# Patient Record
Sex: Female | Born: 1935 | Race: White | Hispanic: No | Smoking: Never smoker
Health system: Southern US, Community
[De-identification: ages and names within clinical notes are randomized; demographics above are authoritative.]

## PROBLEM LIST (undated history)

## (undated) DIAGNOSIS — I10 Essential (primary) hypertension: Secondary | ICD-10-CM

## (undated) DIAGNOSIS — E785 Hyperlipidemia, unspecified: Secondary | ICD-10-CM

## (undated) DIAGNOSIS — F325 Major depressive disorder, single episode, in full remission: Secondary | ICD-10-CM

## (undated) DIAGNOSIS — K219 Gastro-esophageal reflux disease without esophagitis: Secondary | ICD-10-CM

## (undated) DIAGNOSIS — G629 Polyneuropathy, unspecified: Secondary | ICD-10-CM

## (undated) DIAGNOSIS — E039 Hypothyroidism, unspecified: Secondary | ICD-10-CM

## (undated) DIAGNOSIS — I779 Disorder of arteries and arterioles, unspecified: Secondary | ICD-10-CM

## (undated) HISTORY — DX: Hypothyroidism, unspecified: E03.9

## (undated) HISTORY — PX: TOTAL HIP ARTHROPLASTY: SHX124

## (undated) HISTORY — PX: ORIF DISTAL RADIUS FRACTURE: SUR927

## (undated) HISTORY — DX: Disorder of arteries and arterioles, unspecified: I77.9

## (undated) HISTORY — DX: Essential (primary) hypertension: I10

## (undated) HISTORY — DX: Polyneuropathy, unspecified: G62.9

## (undated) HISTORY — DX: Hyperlipidemia, unspecified: E78.5

## (undated) HISTORY — DX: Major depressive disorder, single episode, in full remission: F32.5

## (undated) HISTORY — DX: Gastro-esophageal reflux disease without esophagitis: K21.9

---

## 2019-08-07 ENCOUNTER — Other Ambulatory Visit: Payer: Self-pay

## 2019-08-07 ENCOUNTER — Encounter: Payer: Self-pay | Admitting: Internal Medicine

## 2019-08-07 ENCOUNTER — Ambulatory Visit (INDEPENDENT_AMBULATORY_CARE_PROVIDER_SITE_OTHER): Payer: Medicare Other | Admitting: Internal Medicine

## 2019-08-07 VITALS — BP 130/80 | HR 65 | Temp 97.1°F | Ht 70.0 in | Wt 150.0 lb

## 2019-08-07 DIAGNOSIS — E785 Hyperlipidemia, unspecified: Secondary | ICD-10-CM | POA: Insufficient documentation

## 2019-08-07 DIAGNOSIS — E039 Hypothyroidism, unspecified: Secondary | ICD-10-CM | POA: Diagnosis not present

## 2019-08-07 DIAGNOSIS — I779 Disorder of arteries and arterioles, unspecified: Secondary | ICD-10-CM | POA: Insufficient documentation

## 2019-08-07 DIAGNOSIS — I10 Essential (primary) hypertension: Secondary | ICD-10-CM

## 2019-08-07 DIAGNOSIS — F325 Major depressive disorder, single episode, in full remission: Secondary | ICD-10-CM | POA: Diagnosis not present

## 2019-08-07 DIAGNOSIS — G629 Polyneuropathy, unspecified: Secondary | ICD-10-CM | POA: Insufficient documentation

## 2019-08-07 DIAGNOSIS — I6523 Occlusion and stenosis of bilateral carotid arteries: Secondary | ICD-10-CM

## 2019-08-07 DIAGNOSIS — K219 Gastro-esophageal reflux disease without esophagitis: Secondary | ICD-10-CM

## 2019-08-07 LAB — COMPREHENSIVE METABOLIC PANEL
ALT: 10 U/L (ref 0–35)
AST: 16 U/L (ref 0–37)
Albumin: 4.9 g/dL (ref 3.5–5.2)
Alkaline Phosphatase: 62 U/L (ref 39–117)
BUN: 16 mg/dL (ref 6–23)
CO2: 30 mEq/L (ref 19–32)
Calcium: 10.3 mg/dL (ref 8.4–10.5)
Chloride: 98 mEq/L (ref 96–112)
Creatinine, Ser: 0.8 mg/dL (ref 0.40–1.20)
GFR: 67.6 mL/min (ref >60.00)
Glucose, Bld: 91 mg/dL (ref 70–99)
Potassium: 4.3 mEq/L (ref 3.5–5.1)
Sodium: 137 mEq/L (ref 135–145)
Total Bilirubin: 0.7 mg/dL (ref 0.2–1.2)
Total Protein: 7.3 g/dL (ref 6.0–8.3)

## 2019-08-07 LAB — T4, FREE: Free T4: 1.02 ng/dL (ref 0.60–1.60)

## 2019-08-07 LAB — LIPID PANEL
Cholesterol: 168 mg/dL (ref 0–200)
HDL: 54.7 mg/dL (ref >39.00)
LDL Cholesterol: 90 mg/dL (ref 0–99)
NonHDL: 112.97
Total CHOL/HDL Ratio: 3
Triglycerides: 116 mg/dL (ref 0.0–149.0)
VLDL: 23.2 mg/dL (ref 0.0–40.0)

## 2019-08-07 LAB — CBC
HCT: 41.4 % (ref 36.0–46.0)
Hemoglobin: 14.3 g/dL (ref 12.0–15.0)
MCHC: 34.6 g/dL (ref 30.0–36.0)
MCV: 93.8 fl (ref 78.0–100.0)
Platelets: 215 10*3/uL (ref 150.0–400.0)
RBC: 4.41 Mil/uL (ref 3.87–5.11)
RDW: 13.6 % (ref 11.5–15.5)
WBC: 4.7 10*3/uL (ref 4.0–10.5)

## 2019-08-07 LAB — TSH: TSH: 5.43 u[IU]/mL — ABNORMAL HIGH (ref 0.35–4.50)

## 2019-08-07 NOTE — Assessment & Plan Note (Signed)
In remission on mirtazapine Seems appropriate to continue for now

## 2019-08-07 NOTE — Assessment & Plan Note (Signed)
BP Readings from Last 3 Encounters:  08/07/19 130/80   Good control on metoprolol Will check labs

## 2019-08-07 NOTE — Progress Notes (Signed)
Subjective:    Patient ID: Nicole Ellison, female    DOB: 1928/08/20, 86 y.o.   MRN: [id number]  HPI Here with son Annette Stable to establish care This visit occurred during the SARS-CoV-2 public health emergency.  Safety protocols were in place, including screening questions prior to the visit, additional usage of staff PPE, and extensive cleaning of exam room while observing appropriate contact time as indicated for disinfecting solutions.   Now in independent living at [facility] Came there 10 days ago So far--so good  Has had HTN for some time On metoprolol---seems to be working okay Mild sitting exercises No chest pain or SOB No palpitations No edema  3 recent ER visits  Would have dizziness, floaters in her eyes (seen by retinal specialists), BP elelvated Work up including CT, etc---no diagnosis No recent dizziness  Has been diagnosed with carotid artery disese (and TIA) They don't know that she really had TIA Is on statin  Known HLD No problems with statin  GERD Is on protonix daily No heartburn or dysphagia on this  Has known MDD single after husband died Some anxiety as well Mirtazapine seems to be helping for this Depression seems to be in remission Had seen a counselor for a year or so--- was helpful  Hypothyroidism for years On synthroid 75 Energy levels are okay  Known neuropathy Gabapentin at night helps her sleep  Current Outpatient Medications on File Prior to Visit  Medication Sig Dispense Refill  . atorvastatin (LIPITOR) 10 MG tablet Take 10 mg by mouth daily.    . Calcium Carbonate (CALCIUM 600 PO) Take by mouth 2 (two) times daily.    . clopidogrel (PLAVIX) 75 MG tablet Take 75 mg by mouth daily.    Marland Kitchen gabapentin (NEURONTIN) 400 MG capsule Take 2 capsules by mouth at bedtime.    Marland Kitchen levothyroxine (SYNTHROID) 75 MCG tablet Take 75 mcg by mouth daily before breakfast.    . metoprolol succinate (TOPROL-XL) 25 MG 24 hr tablet Take 1 tablet by  mouth 2 (two) times daily.    . mirtazapine (REMERON) 30 MG tablet Take 30 mg by mouth at bedtime.    . pantoprazole (PROTONIX) 20 MG tablet Take 1 tablet by mouth daily.    . vitamin B-12 (CYANOCOBALAMIN) 1000 MCG tablet Take 1,000 mcg by mouth daily.     No current facility-administered medications on file prior to visit.    Allergies  Allergen Reactions  . Avelox [Moxifloxacin]   . Levaquin [Levofloxacin]     Past Medical History:  Diagnosis Date  . Carotid arterial disease (HCC)   . GERD (gastroesophageal reflux disease)   . Hyperlipidemia   . Hypertension   . Hypothyroidism   . MDD (major depressive disorder), single episode, in full remission (HCC)   . Polyneuropathy     Past Surgical History:  Procedure Laterality Date  . ORIF DISTAL RADIUS FRACTURE     with redo twice  . TOTAL HIP ARTHROPLASTY Right ~2011    Family History  Problem Relation Age of Onset  . Alzheimer's disease Mother   . Heart disease Father   . Liver cancer Sister   . Alzheimer's disease Brother     Social History   Socioeconomic History  . Marital status: Widowed    Spouse name: Not on file  . Number of children: 2  . Years of education: Not on file  . Highest education level: Not on file  Occupational History  . Occupation: Haematologist  Comment: Retired 1995  Tobacco Use  . Smoking status: Never Smoker  . Smokeless tobacco: Never Used  Substance and Sexual Activity  . Alcohol use: Not Currently  . Drug use: Not on file  . Sexual activity: Not on file  Other Topics Concern  . Not on file  Social History Narrative   Has living will   Son Annette Stable is health care POA   Has DNR --confirmed   No tube feeds   Social Determinants of Health   Financial Resource Strain:   . Difficulty of Paying Living Expenses:   Food Insecurity:   . Worried About Programme researcher, broadcasting/film/video in the Last Year:   . Barista in the Last Year:   Transportation Needs:   . Freight forwarder  (Medical):   Marland Kitchen Lack of Transportation (Non-Medical):   Physical Activity:   . Days of Exercise per Week:   . Minutes of Exercise per Session:   Stress:   . Feeling of Stress :   Social Connections:   . Frequency of Communication with Friends and Family:   . Frequency of Social Gatherings with Friends and Family:   . Attends Religious Services:   . Active Member of Clubs or Organizations:   . Attends Banker Meetings:   Marland Kitchen Marital Status:   Intimate Partner Violence:   . Fear of Current or Ex-Partner:   . Emotionally Abused:   Marland Kitchen Physically Abused:   . Sexually Abused:     Review of Systems Appetite is okay Weight is stable Sleeps well with gabapentin--no sig AM grogginess Doesn't drive Wears seat belt Bowels move fine--no fine No urinary problems No sig back or joint pains Right ear hearing seems to have worsened lately    Objective:   Physical Exam Constitutional:      Appearance: Normal appearance.  HENT:     Mouth/Throat:     Mouth: Mucous membranes are moist.     Comments: No lesions Cardiovascular:     Rate and Rhythm: Normal rate and regular rhythm.     Pulses: Normal pulses.     Heart sounds: No murmur heard.  No gallop.   Pulmonary:     Effort: Pulmonary effort is normal.     Breath sounds: Normal breath sounds. No wheezing or rales.  Abdominal:     Palpations: Abdomen is soft.     Tenderness: There is no abdominal tenderness.  Musculoskeletal:     Cervical back: Neck supple.     Right lower leg: No edema.     Left lower leg: No edema.  Lymphadenopathy:     Cervical: No cervical adenopathy.  Skin:    General: Skin is warm.     Findings: No rash.  Neurological:     Mental Status: She is alert.     Comments: Mild leg weakness  Psychiatric:        Mood and Affect: Mood normal.        Behavior: Behavior normal.            Assessment & Plan:

## 2019-08-07 NOTE — Assessment & Plan Note (Signed)
Uses the gabapentin at bedtime Asked her try reducing dose at bedtime

## 2019-08-07 NOTE — Assessment & Plan Note (Signed)
Also has TIA diagnosis--but they don't know about this Is on statin and plavix Will try to get records

## 2019-08-07 NOTE — Assessment & Plan Note (Signed)
Seems to be euthyroid Levothyroxine 75

## 2019-08-07 NOTE — Assessment & Plan Note (Signed)
?  secondary prevention with statin

## 2019-10-18 ENCOUNTER — Telehealth: Payer: Self-pay | Admitting: Internal Medicine

## 2019-10-18 NOTE — Telephone Encounter (Signed)
Annette Stable (pt son) called wanting to make sure brooke has the list of rx that he dropped off yesterday.  He stated he spoke with brooke about pt medication and getting refills.  He stated brooke told him to drop off list to give to brooke.   Envelope on brooke's desk

## 2019-11-11 ENCOUNTER — Other Ambulatory Visit: Payer: Self-pay

## 2019-11-11 MED ORDER — PANTOPRAZOLE SODIUM 20 MG PO TBEC: 20.0000 mg | DELAYED_RELEASE_TABLET | Freq: Every day | ORAL | 3 refills | Status: DC

## 2019-11-11 NOTE — Telephone Encounter (Signed)
Rx sent electronically.  

## 2019-11-11 NOTE — Telephone Encounter (Signed)
Pt's husband called for a refill to [facility]

## 2019-11-18 MED ORDER — LEVOTHYROXINE SODIUM 75 MCG PO TABS: 75.0000 ug | ORAL_TABLET | Freq: Every day | ORAL | 3 refills | Status: DC

## 2019-11-18 MED ORDER — MIRTAZAPINE 30 MG PO TABS: 30.0000 mg | ORAL_TABLET | Freq: Every day | ORAL | 3 refills | Status: DC

## 2019-11-18 MED ORDER — METOPROLOL SUCCINATE ER 25 MG PO TB24: 25.0000 mg | ORAL_TABLET | Freq: Two times a day (BID) | ORAL | 3 refills | Status: DC

## 2019-11-18 MED ORDER — ATORVASTATIN CALCIUM 10 MG PO TABS: 10.0000 mg | ORAL_TABLET | Freq: Every day | ORAL | 3 refills | Status: DC

## 2019-11-18 MED ORDER — CLOPIDOGREL BISULFATE 75 MG PO TABS: 75.0000 mg | ORAL_TABLET | Freq: Every day | ORAL | 3 refills | Status: DC

## 2019-11-18 MED ORDER — GABAPENTIN 400 MG PO CAPS: 800.0000 mg | ORAL_CAPSULE | Freq: Every day | ORAL | 3 refills | Status: DC

## 2019-11-18 NOTE — Telephone Encounter (Signed)
Received another letter in tower up front with medications.

## 2019-11-18 NOTE — Addendum Note (Signed)
Addended by: Eual Fines on: 11/18/2019 04:56 PM   Modules accepted: Orders

## 2019-11-18 NOTE — Telephone Encounter (Signed)
Sent refills to [facility] for everything on the list except for pantoprazole since we just sent that.

## 2019-11-22 ENCOUNTER — Telehealth: Payer: Self-pay | Admitting: Internal Medicine

## 2019-11-22 DIAGNOSIS — H900 Conductive hearing loss, bilateral: Secondary | ICD-10-CM

## 2019-11-22 NOTE — Telephone Encounter (Signed)
For what reason? Called son to find out. Wants a hearing test and a possible new hearing aids. Says her hearing has deteriorated in the last few years.

## 2019-11-22 NOTE — Telephone Encounter (Signed)
Referral placed.

## 2019-11-22 NOTE — Telephone Encounter (Signed)
Pt son called in due to needed a referral for [facility] Dr. Barton Fanny. Please advise

## 2019-12-17 ENCOUNTER — Telehealth: Payer: Self-pay

## 2019-12-17 NOTE — Telephone Encounter (Signed)
Mrs. Ravneet son, left a parking placard application to be filled out by provider. Charge sheet and form are attached to together and left on the cart under Dr. Vassie Moselle name.

## 2019-12-17 NOTE — Telephone Encounter (Signed)
Form signed No charge 

## 2019-12-17 NOTE — Telephone Encounter (Signed)
Patient's son, Annette Stable, notified form is ready for pick up.

## 2019-12-17 NOTE — Telephone Encounter (Signed)
Form placed in Dr Letvak's inbox on his desk 

## 2020-05-26 ENCOUNTER — Encounter: Payer: Self-pay | Admitting: Internal Medicine

## 2020-05-26 ENCOUNTER — Other Ambulatory Visit: Payer: Self-pay

## 2020-05-26 ENCOUNTER — Ambulatory Visit (INDEPENDENT_AMBULATORY_CARE_PROVIDER_SITE_OTHER): Payer: Medicare Other | Admitting: Internal Medicine

## 2020-05-26 VITALS — BP 120/82 | HR 62 | Temp 97.6°F | Ht 70.0 in | Wt 151.0 lb

## 2020-05-26 DIAGNOSIS — I10 Essential (primary) hypertension: Secondary | ICD-10-CM

## 2020-05-26 MED ORDER — LOSARTAN POTASSIUM 25 MG PO TABS: 25.0000 mg | ORAL_TABLET | Freq: Every day | ORAL | 3 refills | Status: DC

## 2020-05-26 NOTE — Assessment & Plan Note (Signed)
BP Readings from Last 3 Encounters:  05/26/20 120/82  08/07/19 130/80   Repeat 160/70 on the left by me May have masked HTN or just anxiety causing increases Discussed options--I recommend starting a low dose of losartan

## 2020-05-26 NOTE — Progress Notes (Signed)
Subjective:    Patient ID: Nicole Ellison, female    DOB: Oct 26, 1928, 87 y.o.   MRN: [id number]  HPI Here to review elevated blood pressures Son is here This visit occurred during the SARS-CoV-2 public health emergency.  Safety protocols were in place, including screening questions prior to the visit, additional usage of staff PPE, and extensive cleaning of exam room while observing appropriate contact time as indicated for disinfecting solutions.   See email from Olean RN at [facility] Some very high measurements--- mostly your own machine Has tried to sit for a while before doing them  Has noted some anxiety Might be related to her high measurements No chest pain or SOB No headaches  Current Outpatient Medications on File Prior to Visit  Medication Sig Dispense Refill  . atorvastatin (LIPITOR) 10 MG tablet Take 1 tablet (10 mg total) by mouth daily. 90 tablet 3  . Calcium Carbonate (CALCIUM 600 PO) Take by mouth 2 (two) times daily.    . clopidogrel (PLAVIX) 75 MG tablet Take 1 tablet (75 mg total) by mouth daily. 90 tablet 3  . gabapentin (NEURONTIN) 400 MG capsule Take 2 capsules (800 mg total) by mouth at bedtime. 180 capsule 3  . levothyroxine (SYNTHROID) 75 MCG tablet Take 1 tablet (75 mcg total) by mouth daily before breakfast. 90 tablet 3  . metoprolol succinate (TOPROL-XL) 25 MG 24 hr tablet Take 1 tablet (25 mg total) by mouth 2 (two) times daily. 180 tablet 3  . mirtazapine (REMERON) 30 MG tablet Take 1 tablet (30 mg total) by mouth at bedtime. 90 tablet 3  . pantoprazole (PROTONIX) 20 MG tablet Take 1 tablet (20 mg total) by mouth daily. 90 tablet 3  . vitamin B-12 (CYANOCOBALAMIN) 1000 MCG tablet Take 1,000 mcg by mouth daily.     No current facility-administered medications on file prior to visit.    Allergies  Allergen Reactions  . Avelox [Moxifloxacin]   . Levaquin [Levofloxacin]     Past Medical History:  Diagnosis Date  . Carotid arterial disease (HCC)   .  GERD (gastroesophageal reflux disease)   . Hyperlipidemia   . Hypertension   . Hypothyroidism   . MDD (major depressive disorder), single episode, in full remission (HCC)   . Polyneuropathy     Past Surgical History:  Procedure Laterality Date  . ORIF DISTAL RADIUS FRACTURE     with redo twice  . TOTAL HIP ARTHROPLASTY Right ~2011    Family History  Problem Relation Age of Onset  . Alzheimer's disease Mother   . Heart disease Father   . Liver cancer Sister   . Alzheimer's disease Brother     Social History   Socioeconomic History  . Marital status: Widowed    Spouse name: Not on file  . Number of children: 2  . Years of education: Not on file  . Highest education level: Not on file  Occupational History  . Occupation: Haematologist    Comment: Retired 1995  Tobacco Use  . Smoking status: Never Smoker  . Smokeless tobacco: Never Used  Substance and Sexual Activity  . Alcohol use: Not Currently  . Drug use: Not on file  . Sexual activity: Not on file  Other Topics Concern  . Not on file  Social History Narrative   Has living will   Son Annette Stable is health care POA   Has DNR --confirmed   No tube feeds   Social Determinants of Health   Financial  Resource Strain: Not on file  Food Insecurity: Not on file  Transportation Needs: Not on file  Physical Activity: Not on file  Stress: Not on file  Social Connections: Not on file  Intimate Partner Violence: Not on file   Review of Systems  Some nausea Sleeps well Appetite is good Weight stable Still doing normal activities--walking regularly     Objective:   Physical Exam Constitutional:      Appearance: Normal appearance.  Cardiovascular:     Rate and Rhythm: Normal rate and regular rhythm.     Heart sounds: No murmur heard. No gallop.   Pulmonary:     Effort: Pulmonary effort is normal.     Breath sounds: Normal breath sounds. No wheezing or rales.  Musculoskeletal:     Cervical back: Neck supple.      Right lower leg: No edema.     Left lower leg: No edema.  Lymphadenopathy:     Cervical: No cervical adenopathy.  Neurological:     Mental Status: She is alert.            Assessment & Plan:

## 2020-06-23 ENCOUNTER — Emergency Department
Admission: EM | Admit: 2020-06-23 | Discharge: 2020-06-23 | Disposition: A | Discharge status: Other Acute Inpatient Hospital | Payer: Medicare Other | Attending: Emergency Medicine | Admitting: Emergency Medicine

## 2020-06-23 ENCOUNTER — Other Ambulatory Visit: Payer: Self-pay

## 2020-06-23 DIAGNOSIS — Z7902 Long term (current) use of antithrombotics/antiplatelets: Secondary | ICD-10-CM | POA: Insufficient documentation

## 2020-06-23 DIAGNOSIS — I10 Essential (primary) hypertension: Secondary | ICD-10-CM | POA: Diagnosis not present

## 2020-06-23 DIAGNOSIS — E039 Hypothyroidism, unspecified: Secondary | ICD-10-CM | POA: Insufficient documentation

## 2020-06-23 DIAGNOSIS — F419 Anxiety disorder, unspecified: Secondary | ICD-10-CM

## 2020-06-23 DIAGNOSIS — Z79899 Other long term (current) drug therapy: Secondary | ICD-10-CM | POA: Diagnosis not present

## 2020-06-23 DIAGNOSIS — Z96641 Presence of right artificial hip joint: Secondary | ICD-10-CM | POA: Insufficient documentation

## 2020-06-23 MED ORDER — LORAZEPAM 1 MG PO TABS: 1.0000 mg | ORAL_TABLET | Freq: Once | ORAL | Status: DC

## 2020-06-23 MED ORDER — LORAZEPAM 0.5 MG PO TABS
0.5000 mg | ORAL_TABLET | Freq: Once | ORAL | Status: AC
  Administered 2020-06-23: 0.5 mg via ORAL
  Filled 2020-06-23: qty 1

## 2020-06-23 NOTE — ED Notes (Signed)
Pt assisted to the bathroom. Urine sample collected and sent to lab

## 2020-06-23 NOTE — ED Provider Notes (Signed)
[facility] Emergency Department Provider Note ____________________________________________   Event Date/Time   First MD Initiated Contact with Patient 06/23/20 1624     (approximate)  I have reviewed the triage vital signs and the nursing notes.  HISTORY  Chief Complaint Hypertension   HPI Ranyah Groeneveld is a 87 y.o. femalewho presents to the ED for evaluation of asymptomatic hypertension.  Chart review indicates history of HTN, HLD, hypothyroidism and depression.  Anxiety. Patient resides at the independent living section of a local SNF, [facility].  I see a video visit with her SNF on 5/3 with complaints of hypertension and she was started on losartan.   Patient presents to the ED via EMS from home for evaluation of asymptomatic hypertension.  Patient reports having a home health nurse that comes every Tuesday to check on her, help around the house and monitor her vital signs.  Patient reports having hypertensive blood pressures at home systolic around 200, and therefore called EMS for evaluation.  Patient tearfully reports that yesterday and Sunday were very difficult day for her due to the recent holiday, memorial day.  She tells me at length about her husband of 67 years who died just 3 years ago, who was a 20-year veteran of the [employer].  She reports significant difficulty on Sunday when watching a parade with other service members and reports feeling lonely and anxious yesterday.  She denies any suicidal thoughts or intent, denies hallucinations.  She denies pain anywhere, including headache, chest pain, syncopal episodes, abdominal pain, emesis or diarrhea.  Denies changes to p.o. intake or toileting.  Past Medical History:  Diagnosis Date  . Carotid arterial disease (HCC)   . GERD (gastroesophageal reflux disease)   . Hyperlipidemia   . Hypertension   . Hypothyroidism   . MDD (major depressive disorder), single episode, in full remission (HCC)   .  Polyneuropathy     Patient Active Problem List   Diagnosis Date Noted  . Hypertension   . Hyperlipidemia   . Hypothyroidism   . MDD (major depressive disorder), single episode, in full remission (HCC)   . Polyneuropathy   . Carotid arterial disease (HCC)   . GERD (gastroesophageal reflux disease)     Past Surgical History:  Procedure Laterality Date  . ORIF DISTAL RADIUS FRACTURE     with redo twice  . TOTAL HIP ARTHROPLASTY Right ~2011    Prior to Admission medications   Medication Sig Start Date End Date Taking? Authorizing Provider  atorvastatin (LIPITOR) 10 MG tablet Take 1 tablet (10 mg total) by mouth daily. 11/18/19   Karie Schwalbe, MD  Calcium Carbonate (CALCIUM 600 PO) Take by mouth 2 (two) times daily.    [provider]  clopidogrel (PLAVIX) 75 MG tablet Take 1 tablet (75 mg total) by mouth daily. 11/18/19   Karie Schwalbe, MD  gabapentin (NEURONTIN) 400 MG capsule Take 2 capsules (800 mg total) by mouth at bedtime. 11/18/19   Karie Schwalbe, MD  levothyroxine (SYNTHROID) 75 MCG tablet Take 1 tablet (75 mcg total) by mouth daily before breakfast. 11/18/19   Karie Schwalbe, MD  losartan (COZAAR) 25 MG tablet Take 1 tablet (25 mg total) by mouth daily. 05/26/20   Karie Schwalbe, MD  metoprolol succinate (TOPROL-XL) 25 MG 24 hr tablet Take 1 tablet (25 mg total) by mouth 2 (two) times daily. 11/18/19   Karie Schwalbe, MD  mirtazapine (REMERON) 30 MG tablet Take 1 tablet (30 mg  total) by mouth at bedtime. 11/18/19   Karie Schwalbe, MD  pantoprazole (PROTONIX) 20 MG tablet Take 1 tablet (20 mg total) by mouth daily. 11/11/19   Karie Schwalbe, MD  vitamin B-12 (CYANOCOBALAMIN) 1000 MCG tablet Take 1,000 mcg by mouth daily.    [provider]    Allergies Avelox [moxifloxacin] and Levaquin [levofloxacin]  Family History  Problem Relation Age of Onset  . Alzheimer's disease Mother   . Heart disease Father   . Liver cancer Sister    . Alzheimer's disease Brother     Social History Social History   Tobacco Use  . Smoking status: Never Smoker  . Smokeless tobacco: Never Used  Substance Use Topics  . Alcohol use: Not Currently    Review of Systems  Constitutional: No fever/chills Eyes: No visual changes. ENT: No sore throat. Cardiovascular: Denies chest pain. Respiratory: Denies shortness of breath. Gastrointestinal: No abdominal pain.  No nausea, no vomiting.  No diarrhea.  No constipation. Genitourinary: Negative for dysuria. Musculoskeletal: Negative for back pain. Skin: Negative for rash. Neurological: Negative for headaches, focal weakness or numbness.  ____________________________________________   PHYSICAL EXAM:  VITAL SIGNS: Vitals:   06/23/20 1830 06/23/20 1832  BP: (!) 185/79 (!) 179/77  Pulse: 63 63  Resp: 19 18  Temp:    SpO2: 98% 98%      Constitutional: Alert and oriented. In no acute distress.  Tearful when discussing her husband and recent memorial day.  Otherwise hard of hearing and conversational. Eyes: Conjunctivae are normal. PERRL. EOMI. Head: Atraumatic. Nose: No congestion/rhinnorhea. Mouth/Throat: Mucous membranes are moist.  Oropharynx non-erythematous. Neck: No stridor. No cervical spine tenderness to palpation. Cardiovascular: Normal rate, regular rhythm. Grossly normal heart sounds.  Good peripheral circulation. Respiratory: Normal respiratory effort.  No retractions. Lungs CTAB. Gastrointestinal: Soft , nondistended, nontender to palpation. No CVA tenderness. Musculoskeletal: No lower extremity tenderness nor edema.  No joint effusions. No signs of acute trauma. Neurologic:  Normal speech and language. No gross focal neurologic deficits are appreciated. No gait instability noted. Cranial nerves II through XII intact 5/5 strength and sensation in all 4 extremities Skin:  Skin is warm, dry and intact. No rash noted. Psychiatric: Mood and affect are normal. Speech  and behavior are normal.  ____________________________________________   LABS (all labs ordered are listed, but only abnormal results are displayed)  Labs Reviewed - No data to display ____________________________________________  12 Lead EKG  Sinus rhythm, rate of 66 bpm.  Normal axis and intervals.  No evidence of acute ischemia. ____________________________________________   PROCEDURES and INTERVENTIONS  Procedure(s) performed (including Critical Care):  .1-3 Lead EKG Interpretation Performed by: Delton Prairie, MD Authorized by: Delton Prairie, MD     Interpretation: normal     ECG rate:  64   ECG rate assessment: normal     Rhythm: sinus rhythm     Ectopy: none     Conduction: normal      Medications  LORazepam (ATIVAN) tablet 0.5 mg (0.5 mg Oral Given 06/23/20 1728)    ____________________________________________   MDM / ED COURSE   87 year old woman presents to the ED with asymptomatic hypertension and anxiety, ultimately amenable to outpatient management.  She presents hypertensive with systolics around 200, and otherwise normal vitals on room air.  She denies symptoms of this and reports feeling fine, until she discusses her recently deceased husband and becomes quite tearful.  She has no evidence of suicidality or emergent psychiatric pathology.  She denies any  pain, including chest pain, headache, syncopal episodes, recent illnesses.  Provide her with a small dose of oral Ativan with resolution of her anxiety and improvement of her blood pressure.  Her EKG is nonischemic and looks well.  Discussed the case at length with patient's son, reports that this happens a few times per year.  He has no additional concerns and I see no indications for more thorough diagnostics at this time considering her lack of symptoms.  She reports compliance with her antihypertensive regimen, and urged her to continue to do this and follow-up with her PCP.  Sat with her at the bedside at  length as we discussed her anxiety, loss of her husband and recently moving to a new SNF where she has not made many new friends yet.  We discussed coping measures for anxiety and we discussed return precautions for the ED.  Patient stable for outpatient management.  Clinical Course as of 06/23/20 1935  Tue Jun 23, 2020  1637 Called son, Annette Stable.  He lives near [facility], just 6 minutes away from mom. Son just had hip replaced 2 weeks ago, can't drive or visit patient. Patient has "these episodes" fairly regularly around holidays related to veterans. Happens a few times per year. Every anniversary, memorial day, veteran's day.  [DS]  1730 Bill calls back to the ED. Reports arranging a ride for his mom to be picked up at about 6:30p.  [DS]  1828 Reassessed.  Patient reports feeling better.  Repeat BP is 183/84.  She reports feeling comfortable going home. [DS]  1829 Continues to deny any pain, including headache, chest pain, shortness of breath or syncope. [DS]  1829 Called Bill back [DS]    Clinical Course User Index [DS] Delton Prairie, MD    ____________________________________________   FINAL CLINICAL IMPRESSION(S) / ED DIAGNOSES  Final diagnoses:  Asymptomatic hypertension  Anxiety     ED Discharge Orders    None       Ruger Saxer Katrinka Blazing   Note:  This document was prepared using Dragon voice recognition software and may include unintentional dictation errors.   Delton Prairie, MD 06/23/20 [phone]

## 2020-06-23 NOTE — ED Notes (Addendum)
Attempted to call [facility] about discharge. Pt's son arranged transportation to facility

## 2020-06-23 NOTE — Discharge Instructions (Signed)
Continue to take all of your medications and follow up with Dr. Alphonsus Sias.   If you develop any severe chest pains, headaches, passing out, please return to ED.

## 2020-06-25 ENCOUNTER — Telehealth: Payer: Self-pay

## 2020-06-25 NOTE — Telephone Encounter (Signed)
Spoke to pt's son to see how pt was doing after recent ER visit for elevated BP and anxiety. Pt's son stated she was fine but gave me her number to call her. I called the pt and she is a bit hard of hearing. She stated she is feeling much better and did not feel her appt in July needed to be moved up to a sooner date.

## 2020-08-04 ENCOUNTER — Other Ambulatory Visit: Payer: Self-pay

## 2020-08-04 ENCOUNTER — Encounter: Payer: Self-pay | Admitting: Internal Medicine

## 2020-08-04 ENCOUNTER — Ambulatory Visit (INDEPENDENT_AMBULATORY_CARE_PROVIDER_SITE_OTHER): Payer: Medicare Other | Admitting: Internal Medicine

## 2020-08-04 VITALS — BP 118/70 | HR 63 | Temp 96.9°F | Ht 68.75 in | Wt 149.0 lb

## 2020-08-04 DIAGNOSIS — E785 Hyperlipidemia, unspecified: Secondary | ICD-10-CM

## 2020-08-04 DIAGNOSIS — I6523 Occlusion and stenosis of bilateral carotid arteries: Secondary | ICD-10-CM | POA: Diagnosis not present

## 2020-08-04 DIAGNOSIS — G629 Polyneuropathy, unspecified: Secondary | ICD-10-CM

## 2020-08-04 DIAGNOSIS — I10 Essential (primary) hypertension: Secondary | ICD-10-CM | POA: Diagnosis not present

## 2020-08-04 DIAGNOSIS — Z0000 Encounter for general adult medical examination without abnormal findings: Secondary | ICD-10-CM | POA: Insufficient documentation

## 2020-08-04 DIAGNOSIS — E039 Hypothyroidism, unspecified: Secondary | ICD-10-CM | POA: Diagnosis not present

## 2020-08-04 DIAGNOSIS — F325 Major depressive disorder, single episode, in full remission: Secondary | ICD-10-CM

## 2020-08-04 DIAGNOSIS — K219 Gastro-esophageal reflux disease without esophagitis: Secondary | ICD-10-CM

## 2020-08-04 LAB — COMPREHENSIVE METABOLIC PANEL
ALT: 11 U/L (ref 0–35)
AST: 17 U/L (ref 0–37)
Albumin: 4.8 g/dL (ref 3.5–5.2)
Alkaline Phosphatase: 57 U/L (ref 39–117)
BUN: 13 mg/dL (ref 6–23)
CO2: 33 mEq/L — ABNORMAL HIGH (ref 19–32)
Calcium: 10.4 mg/dL (ref 8.4–10.5)
Chloride: 99 mEq/L (ref 96–112)
Creatinine, Ser: 0.78 mg/dL (ref 0.40–1.20)
GFR: 67.26 mL/min (ref >60.00)
Glucose, Bld: 97 mg/dL (ref 70–99)
Potassium: 4.4 mEq/L (ref 3.5–5.1)
Sodium: 139 mEq/L (ref 135–145)
Total Bilirubin: 0.8 mg/dL (ref 0.2–1.2)
Total Protein: 7.3 g/dL (ref 6.0–8.3)

## 2020-08-04 LAB — T4, FREE: Free T4: 0.98 ng/dL (ref 0.60–1.60)

## 2020-08-04 LAB — LIPID PANEL
Cholesterol: 170 mg/dL (ref 0–200)
HDL: 53 mg/dL (ref >39.00)
LDL Cholesterol: 96 mg/dL (ref 0–99)
NonHDL: 117.31
Total CHOL/HDL Ratio: 3
Triglycerides: 106 mg/dL (ref 0.0–149.0)
VLDL: 21.2 mg/dL (ref 0.0–40.0)

## 2020-08-04 LAB — CBC
HCT: 39.3 % (ref 36.0–46.0)
Hemoglobin: 13.6 g/dL (ref 12.0–15.0)
MCHC: 34.6 g/dL (ref 30.0–36.0)
MCV: 92 fl (ref 78.0–100.0)
Platelets: 195 10*3/uL (ref 150.0–400.0)
RBC: 4.27 Mil/uL (ref 3.87–5.11)
RDW: 14.1 % (ref 11.5–15.5)
WBC: 3.8 10*3/uL — ABNORMAL LOW (ref 4.0–10.5)

## 2020-08-04 LAB — TSH: TSH: 7.84 u[IU]/mL — ABNORMAL HIGH (ref 0.35–5.50)

## 2020-08-04 MED ORDER — CLOPIDOGREL BISULFATE 75 MG PO TABS
75.0000 mg | ORAL_TABLET | Freq: Every day | ORAL | 3 refills | Status: DC
Start: 2020-08-04 — End: 2021-09-03

## 2020-08-04 MED ORDER — GABAPENTIN 400 MG PO CAPS
800.0000 mg | ORAL_CAPSULE | Freq: Every day | ORAL | 3 refills | Status: DC
Start: 2020-08-04 — End: 2021-08-09

## 2020-08-04 NOTE — Assessment & Plan Note (Signed)
Quiet Discussed skipping 1-2 days a week on the protonix

## 2020-08-04 NOTE — Assessment & Plan Note (Signed)
BP Readings from Last 3 Encounters:  08/04/20 118/70  06/23/20 (!) 179/77  05/26/20 120/82   Good control on the losartan and metoprolol

## 2020-08-04 NOTE — Assessment & Plan Note (Signed)
Does okay with the gabapentin 

## 2020-08-04 NOTE — Assessment & Plan Note (Signed)
I have personally reviewed the Medicare Annual Wellness questionnaire and have noted 1. The patient's medical and social history 2. Their use of alcohol, tobacco or illicit drugs 3. Their current medications and supplements 4. The patient's functional ability including ADL's, fall risks, home safety risks and hearing or visual             impairment. 5. Diet and physical activities 6. Evidence for depression or mood disorders  The patients weight, height, BMI and visual acuity have been recorded in the chart I have made referrals, counseling and provided education to the patient based review of the above and I have provided the pt with a written personalized care plan for preventive services.  I have provided you with a copy of your personalized plan for preventive services. Please take the time to review along with your updated medication list.  Had second COVID booster Has had pneumonia vaccines, etc Td if any injury Discussed resistance exercise No cancer screening due to age

## 2020-08-04 NOTE — Progress Notes (Signed)
Subjective:    Patient ID: Nicole Ellison, female    DOB: March 20, 1928, 87 y.o.   MRN: [id number]  HPI Here with son for Medicare wellness visit and follow up of chronic health conditions This visit occurred during the SARS-CoV-2 public health emergency.  Safety protocols were in place, including screening questions prior to the visit, additional usage of staff PPE, and extensive cleaning of exam room while observing appropriate contact time as indicated for disinfecting solutions.   Reviewed form and advanced directives Reviewed other doctors Walks regularly--- occasionally goes to fitness center Has had 2 minor falls --no injury Hearing is worse--even with aides. ?cerumen build up No alcohol or tobacco Cleaning service monthly--does laundry, cooking, dishes. Some meals delivered Doesn't do stairs No sig problems with memory  BP seems to be better No chest pain or SOB No dizziness ro syncope No edema Some palpitations---with anxiety Known carotid disease ---still on plavix  Depression mostly controlled Mostly gets jittery and nervous--gets things on her mind Does get some nausea when nervous Seeing counselor  Continues on thyroid med Appetite is good Weight down slightly  Continues on gabapentin for neuropathy  Takes protonix daily Controls heartburn--no dysphagia  Current Outpatient Medications on File Prior to Visit  Medication Sig Dispense Refill   atorvastatin (LIPITOR) 10 MG tablet Take 1 tablet (10 mg total) by mouth daily. 90 tablet 3   Calcium Carbonate (CALCIUM 600 PO) Take by mouth 2 (two) times daily.     clopidogrel (PLAVIX) 75 MG tablet Take 1 tablet (75 mg total) by mouth daily. 90 tablet 3   gabapentin (NEURONTIN) 400 MG capsule Take 2 capsules (800 mg total) by mouth at bedtime. 180 capsule 3   levothyroxine (SYNTHROID) 75 MCG tablet Take 1 tablet (75 mcg total) by mouth daily before breakfast. 90 tablet 3   losartan (COZAAR) 25 MG tablet Take 1 tablet (25  mg total) by mouth daily. 90 tablet 3   metoprolol succinate (TOPROL-XL) 25 MG 24 hr tablet Take 1 tablet (25 mg total) by mouth 2 (two) times daily. 180 tablet 3   mirtazapine (REMERON) 30 MG tablet Take 1 tablet (30 mg total) by mouth at bedtime. 90 tablet 3   pantoprazole (PROTONIX) 20 MG tablet Take 1 tablet (20 mg total) by mouth daily. 90 tablet 3   vitamin B-12 (CYANOCOBALAMIN) 1000 MCG tablet Take 1,000 mcg by mouth daily.     No current facility-administered medications on file prior to visit.    Allergies  Allergen Reactions   Avelox [Moxifloxacin]    Levaquin [Levofloxacin]     Past Medical History:  Diagnosis Date   Carotid arterial disease (HCC)    GERD (gastroesophageal reflux disease)    Hyperlipidemia    Hypertension    Hypothyroidism    MDD (major depressive disorder), single episode, in full remission (HCC)    Polyneuropathy     Past Surgical History:  Procedure Laterality Date   ORIF DISTAL RADIUS FRACTURE     with redo twice   TOTAL HIP ARTHROPLASTY Right ~2011    Family History  Problem Relation Age of Onset   Alzheimer's disease Mother    Heart disease Father    Liver cancer Sister    Alzheimer's disease Brother     Social History   Socioeconomic History   Marital status: Widowed    Spouse name: Not on file   Number of children: 2   Years of education: Not on file   Highest education level: Not  on file  Occupational History   Occupation: Haematologist    Comment: Retired 1995  Tobacco Use   Smoking status: Never   Smokeless tobacco: Never  Substance and Sexual Activity   Alcohol use: Not Currently   Drug use: Not on file   Sexual activity: Not on file  Other Topics Concern   Not on file  Social History Narrative   Has living will   Son Annette Stable is health care POA   Has DNR --confirmed   No tube feeds   Social Determinants of Health   Financial Resource Strain: Not on file  Food Insecurity: Not on file  Transportation Needs: Not on  file  Physical Activity: Not on file  Stress: Not on file  Social Connections: Not on file  Intimate Partner Violence: Not on file   Review of Systems Sleeps well Wears seat belt--doesn't drive Teeth okay---keeps up with dentist No suspicious skin lesions Bowels move fine--no blood Voids okay---uses pad for chronic leakage      Objective:   Physical Exam Constitutional:      Appearance: Normal appearance.  HENT:     Ears:     Comments: Canals clear    Mouth/Throat:     Comments: No lesions Eyes:     Conjunctiva/sclera: Conjunctivae normal.     Pupils: Pupils are equal, round, and reactive to light.  Cardiovascular:     Rate and Rhythm: Normal rate and regular rhythm.     Pulses: Normal pulses.     Heart sounds: No murmur heard.   No gallop.  Pulmonary:     Effort: Pulmonary effort is normal.     Breath sounds: Normal breath sounds. No wheezing or rales.  Abdominal:     Palpations: Abdomen is soft.     Tenderness: There is no abdominal tenderness.  Musculoskeletal:     Cervical back: Neck supple.     Right lower leg: No edema.     Left lower leg: No edema.  Lymphadenopathy:     Cervical: No cervical adenopathy.  Skin:    General: Skin is warm.     Findings: No rash.  Neurological:     Mental Status: She is alert and oriented to person, place, and time.     Comments: President--- "Biden, Trump, Obama" 100- "I can't figure that in my head" D-l-r-o-w Recall 3/3  Psychiatric:        Mood and Affect: Mood normal.        Behavior: Behavior normal.           Assessment & Plan:

## 2020-08-04 NOTE — Assessment & Plan Note (Signed)
Secondary prevention with statin 

## 2020-08-04 NOTE — Assessment & Plan Note (Addendum)
Continues on the plavix and statin

## 2020-08-04 NOTE — Assessment & Plan Note (Signed)
Seems to be euthryoid 

## 2020-08-04 NOTE — Assessment & Plan Note (Signed)
Also has situational anxiety Generally doing okay on the mirtazapine

## 2020-08-14 ENCOUNTER — Encounter: Payer: Medicare Other | Admitting: Internal Medicine

## 2020-10-24 ENCOUNTER — Other Ambulatory Visit: Payer: Self-pay | Admitting: Internal Medicine

## 2020-12-26 ENCOUNTER — Other Ambulatory Visit: Payer: Self-pay | Admitting: Internal Medicine

## 2021-01-14 ENCOUNTER — Telehealth: Payer: Self-pay | Admitting: Internal Medicine

## 2021-01-14 NOTE — Telephone Encounter (Signed)
Spoke to pt's son as pt cannot hear well on the phone. EMT did rapid covid test this morning and it was negative. Says she has a slight cough. Nurse Ancil Linsey checked on her.Advised her to take Tylenol for pain if needed. They will keep watching her as needed at [facility].

## 2021-01-14 NOTE — Telephone Encounter (Signed)
Pt's son(Bill) is calling in stating his mom, Nicole Ellison is having a little cough and a runny nose. I offered a Caregility appointment, he has had covid recently, so he declined this appointment.  He does not want to be near his mom right now. They are calling in asking for guidance on what they should do. Please call Bill at [phone].

## 2021-02-22 ENCOUNTER — Ambulatory Visit (INDEPENDENT_AMBULATORY_CARE_PROVIDER_SITE_OTHER): Payer: Medicare Other

## 2021-02-22 ENCOUNTER — Other Ambulatory Visit: Payer: Self-pay

## 2021-02-22 ENCOUNTER — Telehealth: Payer: Self-pay | Admitting: Internal Medicine

## 2021-02-22 ENCOUNTER — Ambulatory Visit (INDEPENDENT_AMBULATORY_CARE_PROVIDER_SITE_OTHER): Payer: Medicare Other | Admitting: Family

## 2021-02-22 ENCOUNTER — Encounter: Payer: Self-pay | Admitting: Family

## 2021-02-22 ENCOUNTER — Ambulatory Visit
Admission: RE | Admit: 2021-02-22 | Discharge: 2021-02-22 | Disposition: A | Discharge status: Home / Self Care | Payer: Medicare Other | Source: Ambulatory Visit | Attending: Family | Admitting: Family

## 2021-02-22 VITALS — BP 172/86 | HR 70 | Temp 97.6°F | Ht 68.75 in | Wt 155.0 lb

## 2021-02-22 DIAGNOSIS — R1011 Right upper quadrant pain: Secondary | ICD-10-CM | POA: Insufficient documentation

## 2021-02-22 DIAGNOSIS — R1012 Left upper quadrant pain: Secondary | ICD-10-CM | POA: Diagnosis present

## 2021-02-22 DIAGNOSIS — R110 Nausea: Secondary | ICD-10-CM | POA: Diagnosis present

## 2021-02-22 LAB — CBC WITH DIFFERENTIAL/PLATELET
Basophils Absolute: 0 10*3/uL (ref 0.0–0.1)
Basophils Relative: 0.4 % (ref 0.0–3.0)
Eosinophils Absolute: 0 10*3/uL (ref 0.0–0.7)
Eosinophils Relative: 0.3 % (ref 0.0–5.0)
HCT: 39 % (ref 36.0–46.0)
Hemoglobin: 13 g/dL (ref 12.0–15.0)
Lymphocytes Relative: 42.3 % (ref 12.0–46.0)
Lymphs Abs: 1.9 10*3/uL (ref 0.7–4.0)
MCHC: 33.2 g/dL (ref 30.0–36.0)
MCV: 92.8 fl (ref 78.0–100.0)
Monocytes Absolute: 0.5 10*3/uL (ref 0.1–1.0)
Monocytes Relative: 11.6 % (ref 3.0–12.0)
Neutro Abs: 2 10*3/uL (ref 1.4–7.7)
Neutrophils Relative %: 45.4 % (ref 43.0–77.0)
Platelets: 186 10*3/uL (ref 150.0–400.0)
RBC: 4.21 Mil/uL (ref 3.87–5.11)
RDW: 14.5 % (ref 11.5–15.5)
WBC: 4.5 10*3/uL (ref 4.0–10.5)

## 2021-02-22 LAB — COMPREHENSIVE METABOLIC PANEL
ALT: 8 U/L (ref 0–35)
AST: 17 U/L (ref 0–37)
Albumin: 4.7 g/dL (ref 3.5–5.2)
Alkaline Phosphatase: 57 U/L (ref 39–117)
BUN: 13 mg/dL (ref 6–23)
CO2: 29 mEq/L (ref 19–32)
Calcium: 10.1 mg/dL (ref 8.4–10.5)
Chloride: 98 mEq/L (ref 96–112)
Creatinine, Ser: 0.78 mg/dL (ref 0.40–1.20)
GFR: 67 mL/min (ref >60.00)
Glucose, Bld: 97 mg/dL (ref 70–99)
Potassium: 4.1 mEq/L (ref 3.5–5.1)
Sodium: 136 mEq/L (ref 135–145)
Total Bilirubin: 0.7 mg/dL (ref 0.2–1.2)
Total Protein: 7.2 g/dL (ref 6.0–8.3)

## 2021-02-22 LAB — LIPASE: Lipase: 37 U/L (ref 11.0–59.0)

## 2021-02-22 LAB — AMYLASE: Amylase: 57 U/L (ref 27–131)

## 2021-02-22 NOTE — Telephone Encounter (Signed)
Dr Alphonsus Sias will not be in the office until after 12. I will forward to him as an FYI.

## 2021-02-22 NOTE — Assessment & Plan Note (Signed)
Bland diet for now, pending workup

## 2021-02-22 NOTE — Progress Notes (Signed)
Refer to dx imaging for report, labs stable. No acute findings.

## 2021-02-22 NOTE — Telephone Encounter (Signed)
I am glad she was able to get in so quick. We will await the results of the ordered tests

## 2021-02-22 NOTE — Telephone Encounter (Signed)
Son called in and stated that he thnks his mom has a stomach issue due to she is having loose bowel movements. And he called her this morning, and now she having pain on her side and stomach. She is getting seen today at 63 with NP TABITHA

## 2021-02-22 NOTE — Patient Instructions (Addendum)
An ultrasound of your abdomen was ordered as well: [facility] - [address] [place] Across from [facility]  Appt is at 3:15 pm however arrive by 230 pm.  Do not have anything to eat or drink, not even a sip of water until after appointment.  A diagnostic test (xray abdomen)was ordered for today, and is to be performed today on the way out of the office.   I have sent the order over to the facility.  Please give this center a call, and schedule your appointment to have this test completed. Once results are received, I will be in touch.   Stop by the lab prior to leaving today. I will notify you of your results once received.   It was a pleasure seeing you today! Please do not hesitate to reach out with any questions and or concerns.  Regards,   Mort Sawyers FNP-C

## 2021-02-22 NOTE — Assessment & Plan Note (Signed)
abd kub xray as well as u/s gallbladder ordered, labs ordered for abdominal panel workup. Pending results. Recommend bland diet for next 24-48 hours. If sudden worsening pain go to er. ddx GI virus and or cholecystitis ° °

## 2021-02-22 NOTE — Assessment & Plan Note (Addendum)
abd kub xray as well as u/s gallbladder ordered, labs ordered for abdominal panel workup. Pending results. Recommend bland diet for next 24-48 hours. If sudden worsening pain go to er. ddx GI virus and or cholecystitis

## 2021-02-22 NOTE — Progress Notes (Signed)
So far, abdominal XRAyY negative for stool obstruction or other acute concerns.

## 2021-02-22 NOTE — Progress Notes (Signed)
Established Patient Office Visit  Subjective:  Patient ID: Nicole Ellison, female    DOB: Jun 13, 1928  Age: 88 y.o. MRN: [id number]  CC:  Chief Complaint  Patient presents with   Abdominal Pain   Nausea   Diarrhea    HPI Nicole Ellison is here today with concerns.   About two days ago started with diarrhea with urgency.  This am with some mild tenderness right lower abdominal tenderness/throbbing. When she went to the bathroom with increased flatulence and some increased burping. Some nausea, feeling like she would throw up but didn't end up throwing up.   Past Medical History:  Diagnosis Date   Carotid arterial disease (HCC)    GERD (gastroesophageal reflux disease)    Hyperlipidemia    Hypertension    Hypothyroidism    MDD (major depressive disorder), single episode, in full remission ([place])    Polyneuropathy     Past Surgical History:  Procedure Laterality Date   ORIF DISTAL RADIUS FRACTURE     with redo twice   TOTAL HIP ARTHROPLASTY Right ~2011    Family History  Problem Relation Age of Onset   Alzheimer's disease Mother    Heart disease Father    Liver cancer Sister    Alzheimer's disease Brother     Social History   Socioeconomic History   Marital status: Widowed    Spouse name: Not on file   Number of children: 2   Years of education: Not on file   Highest education level: Not on file  Occupational History   Occupation: Bank Teller    Comment: Retired 1995  Tobacco Use   Smoking status: Never   Smokeless tobacco: Never  Substance and Sexual Activity   Alcohol use: Not Currently   Drug use: Not on file   Sexual activity: Not on file  Other Topics Concern   Not on file  Social History Narrative   Has living will   Son Rush Landmark is health care POA   Has DNR --confirmed   No tube feeds   Social Determinants of Health   Financial Resource Strain: Not on file  Food Insecurity: Not on file  Transportation Needs: Not on file  Physical Activity: Not on  file  Stress: Not on file  Social Connections: Not on file  Intimate Partner Violence: Not on file    Outpatient Medications Prior to Visit  Medication Sig Dispense Refill   atorvastatin (LIPITOR) 10 MG tablet TAKE 1 TABLET DAILY 90 tablet 3   Calcium Carbonate (CALCIUM 600 PO) Take by mouth 2 (two) times daily.     clopidogrel (PLAVIX) 75 MG tablet Take 1 tablet (75 mg total) by mouth daily. 90 tablet 3   gabapentin (NEURONTIN) 400 MG capsule Take 2 capsules (800 mg total) by mouth at bedtime. 180 capsule 3   levothyroxine (SYNTHROID) 75 MCG tablet TAKE 1 TABLET DAILY BEFORE BREAKFAST 90 tablet 3   losartan (COZAAR) 25 MG tablet Take 1 tablet (25 mg total) by mouth daily. 90 tablet 3   metoprolol succinate (TOPROL-XL) 25 MG 24 hr tablet TAKE 1 TABLET TWICE A DAY 180 tablet 3   mirtazapine (REMERON) 30 MG tablet TAKE 1 TABLET AT BEDTIME 90 tablet 3   pantoprazole (PROTONIX) 20 MG tablet TAKE 1 TABLET DAILY 90 tablet 3   vitamin B-12 (CYANOCOBALAMIN) 1000 MCG tablet Take 1,000 mcg by mouth daily.     No facility-administered medications prior to visit.    Allergies  Allergen Reactions  Avelox [Moxifloxacin]    Levaquin [Levofloxacin]     ROS Review of Systems  Constitutional:  Negative for chills, fatigue, fever and unexpected weight change.  Respiratory:  Negative for cough, shortness of breath and wheezing.   Cardiovascular:  Negative for chest pain, palpitations and leg swelling.  Gastrointestinal:  Positive for abdominal pain (aching rlq however resolving), diarrhea and nausea. Negative for abdominal distention, blood in stool, constipation and vomiting.  Genitourinary:  Negative for difficulty urinating, dysuria, frequency and urgency.     Objective:    Physical Exam Constitutional:      General: She is not in acute distress.    Appearance: She is well-developed and normal weight. She is not ill-appearing, toxic-appearing or diaphoretic.  Cardiovascular:     Rate and  Rhythm: Normal rate and regular rhythm.  Pulmonary:     Effort: Pulmonary effort is normal.     Breath sounds: Normal breath sounds.  Abdominal:     General: Bowel sounds are increased.     Tenderness: There is abdominal tenderness in the right upper quadrant and left upper quadrant.  Neurological:     Mental Status: She is alert.    BP (!) 172/86    Pulse 70    Temp 97.6 F (36.4 C) (Temporal)    Ht 5' 8.75" (1.746 m)    Wt 155 lb (70.3 kg)    SpO2 98%    BMI 23.06 kg/m  Wt Readings from Last 3 Encounters:  02/22/21 155 lb (70.3 kg)  08/04/20 149 lb (67.6 kg)  06/23/20 151 lb 0.2 oz (68.5 kg)     Health Maintenance Due  Topic Date Due   TETANUS/TDAP  Never done   Pneumonia Vaccine 1+ Years old (1 - PCV) Never done   DEXA SCAN  Never done   COVID-19 Vaccine (5 - Booster for Moderna series) 08/06/2020   INFLUENZA VACCINE  Never done    There are no preventive care reminders to display for this patient.  Lab Results  Component Value Date   TSH 7.84 (H) 08/04/2020   Lab Results  Component Value Date   WBC 3.8 (L) 08/04/2020   HGB 13.6 08/04/2020   HCT 39.3 08/04/2020   MCV 92.0 08/04/2020   PLT 195.0 08/04/2020   Lab Results  Component Value Date   NA 139 08/04/2020   K 4.4 08/04/2020   CO2 33 (H) 08/04/2020   GLUCOSE 97 08/04/2020   BUN 13 08/04/2020   CREATININE 0.78 08/04/2020   BILITOT 0.8 08/04/2020   ALKPHOS 57 08/04/2020   AST 17 08/04/2020   ALT 11 08/04/2020   PROT 7.3 08/04/2020   ALBUMIN 4.8 08/04/2020   CALCIUM 10.4 08/04/2020   GFR 67.26 08/04/2020   No results found for: HGBA1C    Assessment & Plan:   Problem List Items Addressed This Visit       Other   Nausea    Bland diet for now, pending workup      Relevant Orders   US Abdomen Complete   DG Abd 1 View   CBC w/Diff   Comprehensive metabolic panel   Urinalysis w microscopic + reflex cultur   Amylase   Lipase   Left upper quadrant abdominal pain    abd kub xray as well  as u/s gallbladder ordered, labs ordered for abdominal panel workup. Pending results. Recommend bland diet for next 24-48 hours. If sudden worsening pain go to er. ddx GI virus and or cholecystitis  Relevant Orders   US Abdomen Complete   DG Abd 1 View   CBC w/Diff   Comprehensive metabolic panel   Urinalysis w microscopic + reflex cultur   Amylase   Lipase   Right upper quadrant abdominal pain - Primary    abd kub xray as well as u/s gallbladder ordered, labs ordered for abdominal panel workup. Pending results. Recommend bland diet for next 24-48 hours. If sudden worsening pain go to er. ddx GI virus and or cholecystitis      Relevant Orders   US Abdomen Complete   DG Abd 1 View   CBC w/Diff   Comprehensive metabolic panel   Urinalysis w microscopic + reflex cultur   Amylase   Lipase    No orders of the defined types were placed in this encounter.   Follow-up: Return if symptoms worsen or fail to improve.    Eugenia Pancoast, FNP

## 2021-02-23 ENCOUNTER — Telehealth: Payer: Self-pay | Admitting: Internal Medicine

## 2021-02-23 NOTE — Telephone Encounter (Signed)
Patient's son called and informed that we are waiting for results he stated understanding. Patient's son was confirmed that he will get a call as soon as we get results.

## 2021-02-23 NOTE — Telephone Encounter (Signed)
Pt son called very concern about pt. Pt son checking on  the status of pt Urine Culture. Please advise.

## 2021-02-23 NOTE — Progress Notes (Signed)
Good morning! It looks like Nicole Ellison may have a urinary tract infection, I am pending the culture of the urine to decide if need to treat as this will confirm that she has a UTI.

## 2021-02-24 NOTE — Telephone Encounter (Signed)
Mr. Kundert called back, in reference to his mom. He has seen the result in My Chart and wanted to know if his mom would be placed on an antibiotic.  Please call the patient's son at [phone]

## 2021-02-24 NOTE — Telephone Encounter (Signed)
Spoke with Mr. Nicole Ellison, the patient's test results are not complete and is worried that not treating it could make her sick. Patient's son was informed that we are waiting for the results to come back so we can treat her properly so she is not taking the wrong ABX. Patint's son stated understanding and will await our call.

## 2021-02-24 NOTE — Telephone Encounter (Signed)
Called the son again to inform him that Nicole Ellison will be submitting ABX tomorrow morning when she comes in. He stated understanding.

## 2021-02-25 ENCOUNTER — Other Ambulatory Visit: Payer: Self-pay | Admitting: Family

## 2021-02-25 DIAGNOSIS — N3000 Acute cystitis without hematuria: Secondary | ICD-10-CM

## 2021-02-25 LAB — URINALYSIS W MICROSCOPIC + REFLEX CULTURE
Bilirubin Urine: NEGATIVE
Glucose, UA: NEGATIVE
Hgb urine dipstick: NEGATIVE
Ketones, ur: NEGATIVE
Nitrites, Initial: POSITIVE — AB
Protein, ur: NEGATIVE
RBC / HPF: NONE SEEN /HPF (ref 0–2)
Specific Gravity, Urine: 1.007 (ref 1.001–1.035)
Squamous Epithelial / HPF: NONE SEEN /HPF (ref <=5)
WBC, UA: >=60 /HPF — AB (ref 0–5)
pH: 6.5 (ref 5.0–8.0)

## 2021-02-25 LAB — URINE CULTURE
MICRO NUMBER:: 12940001
SPECIMEN QUALITY:: ADEQUATE

## 2021-02-25 LAB — CULTURE INDICATED

## 2021-02-25 MED ORDER — SULFAMETHOXAZOLE-TRIMETHOPRIM 800-160 MG PO TABS: 1.0000 | ORAL_TABLET | Freq: Two times a day (BID) | ORAL | 0 refills | Status: AC

## 2021-03-07 ENCOUNTER — Encounter: Payer: Self-pay | Admitting: Emergency Medicine

## 2021-03-07 ENCOUNTER — Emergency Department
Admission: EM | Admit: 2021-03-07 | Discharge: 2021-03-08 | Disposition: A | Discharge status: Skilled Nursing Facility | Payer: Medicare Other | Attending: Emergency Medicine | Admitting: Emergency Medicine

## 2021-03-07 ENCOUNTER — Other Ambulatory Visit: Payer: Self-pay

## 2021-03-07 DIAGNOSIS — I10 Essential (primary) hypertension: Secondary | ICD-10-CM | POA: Diagnosis not present

## 2021-03-07 DIAGNOSIS — D72829 Elevated white blood cell count, unspecified: Secondary | ICD-10-CM | POA: Diagnosis not present

## 2021-03-07 DIAGNOSIS — R8290 Unspecified abnormal findings in urine: Secondary | ICD-10-CM | POA: Diagnosis not present

## 2021-03-07 DIAGNOSIS — R112 Nausea with vomiting, unspecified: Secondary | ICD-10-CM | POA: Diagnosis not present

## 2021-03-07 LAB — TROPONIN I (HIGH SENSITIVITY): Troponin I (High Sensitivity): 6 ng/L (ref <18)

## 2021-03-07 LAB — CBC WITH DIFFERENTIAL/PLATELET
Abs Immature Granulocytes: 0.17 10*3/uL — ABNORMAL HIGH (ref 0.00–0.07)
Basophils Absolute: 0 10*3/uL (ref 0.0–0.1)
Basophils Relative: 0 %
Eosinophils Absolute: 0 10*3/uL (ref 0.0–0.5)
Eosinophils Relative: 0 %
HCT: 41.2 % (ref 36.0–46.0)
Hemoglobin: 13.4 g/dL (ref 12.0–15.0)
Immature Granulocytes: 1 %
Lymphocytes Relative: 7 %
Lymphs Abs: 0.9 10*3/uL (ref 0.7–4.0)
MCH: 31 pg (ref 26.0–34.0)
MCHC: 32.5 g/dL (ref 30.0–36.0)
MCV: 95.4 fL (ref 80.0–100.0)
Monocytes Absolute: 1.3 10*3/uL — ABNORMAL HIGH (ref 0.1–1.0)
Monocytes Relative: 11 %
Neutro Abs: 10 10*3/uL — ABNORMAL HIGH (ref 1.7–7.7)
Neutrophils Relative %: 81 %
Platelets: 182 10*3/uL (ref 150–400)
RBC: 4.32 MIL/uL (ref 3.87–5.11)
RDW: 13.1 % (ref 11.5–15.5)
WBC: 12.4 10*3/uL — ABNORMAL HIGH (ref 4.0–10.5)
nRBC: 0 % (ref 0.0–0.2)

## 2021-03-07 LAB — COMPREHENSIVE METABOLIC PANEL
ALT: 13 U/L (ref 0–44)
AST: 22 U/L (ref 15–41)
Albumin: 4.6 g/dL (ref 3.5–5.0)
Alkaline Phosphatase: 52 U/L (ref 38–126)
Anion gap: 10 (ref 5–15)
BUN: 18 mg/dL (ref 8–23)
CO2: 26 mmol/L (ref 22–32)
Calcium: 9.6 mg/dL (ref 8.9–10.3)
Chloride: 99 mmol/L (ref 98–111)
Creatinine, Ser: 0.66 mg/dL (ref 0.44–1.00)
GFR, Estimated: >60 mL/min (ref >60)
Glucose, Bld: 116 mg/dL — ABNORMAL HIGH (ref 70–99)
Potassium: 3.5 mmol/L (ref 3.5–5.1)
Sodium: 135 mmol/L (ref 135–145)
Total Bilirubin: 0.6 mg/dL (ref 0.3–1.2)
Total Protein: 7.3 g/dL (ref 6.5–8.1)

## 2021-03-07 LAB — URINALYSIS, ROUTINE W REFLEX MICROSCOPIC
Bilirubin Urine: NEGATIVE
Glucose, UA: NEGATIVE mg/dL
Ketones, ur: NEGATIVE mg/dL
Nitrite: NEGATIVE
Protein, ur: 100 mg/dL — AB
Specific Gravity, Urine: 1.015 (ref 1.005–1.030)
pH: 6.5 (ref 5.0–8.0)

## 2021-03-07 LAB — URINALYSIS, MICROSCOPIC (REFLEX)

## 2021-03-07 LAB — LIPASE, BLOOD: Lipase: 49 U/L (ref 11–51)

## 2021-03-07 MED ORDER — ONDANSETRON 4 MG PO TBDP: 4.0000 mg | ORAL_TABLET | Freq: Three times a day (TID) | ORAL | 0 refills | Status: DC | PRN

## 2021-03-07 MED ORDER — ONDANSETRON HCL 4 MG/2ML IJ SOLN
4.0000 mg | Freq: Once | INTRAMUSCULAR | Status: AC
Start: 2021-03-07 — End: 2021-03-07
  Administered 2021-03-07: 4 mg via INTRAVENOUS
  Filled 2021-03-07: qty 2

## 2021-03-07 MED ORDER — LACTATED RINGERS IV BOLUS
1000.0000 mL | Freq: Once | INTRAVENOUS | Status: AC
  Administered 2021-03-07: 1000 mL via INTRAVENOUS

## 2021-03-07 NOTE — ED Notes (Signed)
This RN contacted pt son Annette Stable and he stated that it is okay to send her home via EMS.

## 2021-03-07 NOTE — ED Provider Notes (Signed)
[facility] Provider Note    Event Date/Time   First MD Initiated Contact with Patient 03/07/21 2018     (approximate)   History   Chief Complaint Hypertension (W/emesis)   HPI  Nicole Ellison is a 88 y.o. female with past medical history of hypertension, hyperlipidemia, GERD, and depression who presents to the ED complaining of nausea and vomiting.  Patient reports that she became nauseous earlier this evening and vomited about 3 times.  She states she is feeling much better after she vomited and emptied her stomach, denies any associated abdominal pain.  She states her nausea is improved and she has not had any pain in her chest, trouble breathing, fevers, dysuria, or flank pain.  She resides at [facility] facility and staff there were concerned that her blood pressure was also elevated.  Patient denies any vision changes, speech changes, numbness, weakness, chest pain, or shortness of breath.  Patient reports feeling well prior to onset of nausea and vomiting earlier this evening.     Physical Exam   Triage Vital Signs: ED Triage Vitals [03/07/21 2020]  Enc Vitals Group     BP      Pulse      Resp      Temp      Temp src      SpO2 98 %     Weight      Height      Head Circumference      Peak Flow      Pain Score      Pain Loc      Pain Edu?      Excl. in GC?     Most recent vital signs: Vitals:   03/07/21 2026 03/07/21 2155  BP: (!) 180/90 (!) 151/69  Pulse: (!) 101 94  Resp: (!) 22 16  Temp: 97.6 F (36.4 C)   SpO2: 99% 95%    Constitutional: Alert and oriented. Eyes: Conjunctivae are normal. Head: Atraumatic. Nose: No congestion/rhinnorhea. Mouth/Throat: Mucous membranes are moist.  Cardiovascular: Normal rate, regular rhythm. Grossly normal heart sounds.  2+ radial pulses bilaterally. Respiratory: Normal respiratory effort.  No retractions. Lungs CTAB. Gastrointestinal: Soft and nontender. No distention. Musculoskeletal:  No lower extremity tenderness nor edema.  Neurologic:  Normal speech and language. No gross focal neurologic deficits are appreciated.    ED Results / Procedures / Treatments   Labs (all labs ordered are listed, but only abnormal results are displayed) Labs Reviewed  CBC WITH DIFFERENTIAL/PLATELET - Abnormal; Notable for the following components:      Result Value   WBC 12.4 (*)    Neutro Abs 10.0 (*)    Monocytes Absolute 1.3 (*)    Abs Immature Granulocytes 0.17 (*)    All other components within normal limits  COMPREHENSIVE METABOLIC PANEL - Abnormal; Notable for the following components:   Glucose, Bld 116 (*)    All other components within normal limits  URINALYSIS, ROUTINE W REFLEX MICROSCOPIC - Abnormal; Notable for the following components:   Hgb urine dipstick TRACE (*)    Protein, ur 100 (*)    Leukocytes,Ua SMALL (*)    All other components within normal limits  URINALYSIS, MICROSCOPIC (REFLEX) - Abnormal; Notable for the following components:   Bacteria, UA MANY (*)    All other components within normal limits  URINE CULTURE  LIPASE, BLOOD  TROPONIN I (HIGH SENSITIVITY)     EKG  ED ECG REPORT I, Chesley Noon,  the attending physician, personally viewed and interpreted this ECG.   Date: 03/07/2021  EKG Time: 20:24  Rate: 103  Rhythm: sinus tachycardia  Axis: LAD  Intervals:none  ST&T Change: None   PROCEDURES:  Critical Care performed: No  Procedures   MEDICATIONS ORDERED IN ED: Medications  ondansetron (ZOFRAN) injection 4 mg (4 mg Intravenous Given 03/07/21 2053)  lactated ringers bolus 1,000 mL (1,000 mLs Intravenous Bolus 03/07/21 2053)     IMPRESSION / MDM / ASSESSMENT AND PLAN / ED COURSE  I reviewed the triage vital signs and the nursing notes.                              88 y.o. female with past medical history of hypertension, hyperlipidemia, depression, and GERD who presents to the ED complaining of episode of nausea and vomiting  earlier this evening along with concern for elevated blood pressure by staff at her nursing facility.  Differential diagnosis includes, but is not limited to, gastroenteritis, pancreatitis, hepatitis, UTI, biliary colic, ACS, dehydration, electrolyte abnormality, stroke, AKI.  Patient is nontoxic-appearing and in no acute distress, vital signs remarkable for mild hypertension but otherwise reassuring.  Patient denies any symptoms concerning for hypertensive emergency and no focal neurologic deficits to suggest stroke.  She has a benign abdominal exam.  We will check EKG and labs including CBC, CMP, lipase, and troponin.  Plan to hydrate with IV fluids and treat symptomatically with IV Zofran, reassess following labs.  EKG shows no evidence of arrhythmia or ischemia and patient's blood pressure is gradually improving without intervention.  Labs are reassuring with CMP showing no AKI or electrolyte abnormality, LFTs within normal limits.  Troponin is also unremarkable and there is no evidence of hypertensive emergency.  CBC shows mild leukocytosis but no anemia.  Patient reports feeling much better on reassessment, UA is borderline for infection but she denies any urinary symptoms.  We will send for culture and hold off on antibiotics.  Patient is appropriate for outpatient follow-up with her PCP and was counseled to return to the ED for new worsening symptoms.  Patient agrees with plan.      FINAL CLINICAL IMPRESSION(S) / ED DIAGNOSES   Final diagnoses:  Nausea and vomiting, unspecified vomiting type  Primary hypertension     Rx / DC Orders   ED Discharge Orders          Ordered    ondansetron (ZOFRAN-ODT) 4 MG disintegrating tablet  Every 8 hours PRN        03/07/21 2306             Note:  This document was prepared using Dragon voice recognition software and may include unintentional dictation errors.   Chesley Noon, MD 03/07/21 2308

## 2021-03-07 NOTE — ED Triage Notes (Signed)
Pt arrives via [facility] c/o hypertension 178/114 and emesis x 3,  diarrhea x 1. Pt denies nausea or pain at this time.

## 2021-03-07 NOTE — ED Notes (Signed)
Please contact [place],Bill (Son)  [phone]  for discharge.  Pt lives in independent living and son does not want mom going home by herself.

## 2021-03-07 NOTE — ED Notes (Signed)
[facility] to transport

## 2021-03-07 NOTE — ED Notes (Signed)
This RN assisted pt to toilet and back to bed with walker and 1 assist.  Pt tolerated well.

## 2021-03-08 ENCOUNTER — Telehealth: Payer: Self-pay | Admitting: Internal Medicine

## 2021-03-08 NOTE — Telephone Encounter (Signed)
Is this something [facility] has for her? There is a copy in there chart from 2021. Will see if Dr Silvio Pate wants to do a new one.

## 2021-03-08 NOTE — Telephone Encounter (Signed)
Pt son called to get a copy of his mothers DNR. Pt has a Wellness visit in July 2022

## 2021-03-09 LAB — URINE CULTURE: Culture: 50000 — AB

## 2021-03-10 NOTE — Telephone Encounter (Signed)
Left message on VM for Nicole Ellison to find out if he wants to come pick it up or mail it to him.

## 2021-03-10 NOTE — Telephone Encounter (Signed)
Per [name], Nicole Ellison came to the office to get the [facility]. She got it off of my desk.

## 2021-03-11 ENCOUNTER — Other Ambulatory Visit: Payer: Self-pay | Admitting: Ophthalmology

## 2021-03-11 ENCOUNTER — Other Ambulatory Visit
Admission: RE | Admit: 2021-03-11 | Discharge: 2021-03-11 | Disposition: A | Discharge status: Home / Self Care | Payer: Medicare Other | Attending: Ophthalmology | Admitting: Ophthalmology

## 2021-03-11 DIAGNOSIS — G453 Amaurosis fugax: Secondary | ICD-10-CM | POA: Insufficient documentation

## 2021-03-11 LAB — SEDIMENTATION RATE: Sed Rate: 16 mm/hr (ref 0–30)

## 2021-03-11 LAB — C-REACTIVE PROTEIN: CRP: 0.6 mg/dL (ref <1.0)

## 2021-03-18 ENCOUNTER — Other Ambulatory Visit: Payer: Self-pay

## 2021-03-18 ENCOUNTER — Ambulatory Visit
Admission: RE | Admit: 2021-03-18 | Discharge: 2021-03-18 | Disposition: A | Discharge status: Home / Self Care | Payer: Medicare Other | Source: Ambulatory Visit | Attending: Ophthalmology | Admitting: Ophthalmology

## 2021-03-18 DIAGNOSIS — G453 Amaurosis fugax: Secondary | ICD-10-CM | POA: Diagnosis present

## 2021-03-31 ENCOUNTER — Telehealth: Payer: Self-pay | Admitting: Internal Medicine

## 2021-03-31 NOTE — Telephone Encounter (Signed)
Placed in Dr Alla German inbox on his desk to complete when he returns. ?

## 2021-03-31 NOTE — Telephone Encounter (Signed)
Pt son (bill) dropped off paperwork  ? ?Type of forms received:[place] ? ?Routed [id number] ? ?Paperwork received by : terrill ? ? ?Individual made aware of 3-5 business day turn around (Y/N):y ? ?Form completed and patient made aware of charges(Y/N): ?y ? ?Faxed to :  ? ?Form location:  [place] box ? ? ? ?

## 2021-04-14 ENCOUNTER — Other Ambulatory Visit: Payer: Self-pay | Admitting: Internal Medicine

## 2021-04-27 NOTE — Telephone Encounter (Signed)
Pt son Annette Stable call want's to know the status of paper work if it was mailed . Also would like to get a copy of pt last after visit summery . Marland Kitchen Please advise [phone]  ?

## 2021-04-27 NOTE — Telephone Encounter (Signed)
Spoke to pt's son. Advised I did mail that out last week. Printed AVS from July 2022 for him to pick-up this afternoon. ?

## 2021-05-13 ENCOUNTER — Telehealth: Payer: Self-pay

## 2021-05-13 NOTE — Telephone Encounter (Signed)
I spoke with Nicole Ellison (DPR signed) pt has already spoken with Carollee Herter CMA this morning who is working from home. Bill said that [facility] CMA has advised Bill to bring form to office and get the form stamped with stamp at [employer].Nicole Ellison said nothing further needed at this time but appreciates cb. ?

## 2021-05-13 NOTE — Telephone Encounter (Signed)
[facility] Night - Client ?Nonclinical Telephone Record  ?AccessNurse? ?Client [facility] Night - Client ?Client Site [facility] - Night ?Provider Tillman Abide- MD ?Contact Type Call ?Who Is Calling Patient / Member / Family / Caregiver ?Caller Name Indigo Barbian ?Caller Phone Number [phone] ?Patient Name Nicole Ellison ?Patient DOB 1928-10-09 ?Call Type Message Only Information Provided ?Reason for Call Request for General Office Information ?Initial Comment Requesting to speak with Carollee Herter regarding his mother. ?Additional Comment Please give message to [facility] only, per caller. Provided office hours. ?Disp. Time Disposition Final User ?05/13/2021 8:09:53 AM General Information Provided Yes Junius Creamer ?Call Closed By: Junius Creamer ?Transaction Date/Time: 05/13/2021 8:06:52 AM (ET ?

## 2021-08-07 ENCOUNTER — Other Ambulatory Visit: Payer: Self-pay | Admitting: Internal Medicine

## 2021-08-11 ENCOUNTER — Telehealth: Payer: Self-pay | Admitting: Internal Medicine

## 2021-08-11 ENCOUNTER — Ambulatory Visit (INDEPENDENT_AMBULATORY_CARE_PROVIDER_SITE_OTHER): Payer: Medicare Other | Admitting: Internal Medicine

## 2021-08-11 ENCOUNTER — Encounter: Payer: Self-pay | Admitting: Internal Medicine

## 2021-08-11 VITALS — BP 118/78 | HR 63 | Temp 97.9°F | Ht 68.5 in | Wt 153.0 lb

## 2021-08-11 DIAGNOSIS — L570 Actinic keratosis: Secondary | ICD-10-CM

## 2021-08-11 DIAGNOSIS — E039 Hypothyroidism, unspecified: Secondary | ICD-10-CM | POA: Diagnosis not present

## 2021-08-11 DIAGNOSIS — I6523 Occlusion and stenosis of bilateral carotid arteries: Secondary | ICD-10-CM

## 2021-08-11 DIAGNOSIS — I10 Essential (primary) hypertension: Secondary | ICD-10-CM

## 2021-08-11 DIAGNOSIS — F325 Major depressive disorder, single episode, in full remission: Secondary | ICD-10-CM

## 2021-08-11 DIAGNOSIS — K219 Gastro-esophageal reflux disease without esophagitis: Secondary | ICD-10-CM | POA: Diagnosis not present

## 2021-08-11 DIAGNOSIS — G629 Polyneuropathy, unspecified: Secondary | ICD-10-CM

## 2021-08-11 DIAGNOSIS — Z0000 Encounter for general adult medical examination without abnormal findings: Secondary | ICD-10-CM

## 2021-08-11 LAB — CBC
HCT: 38.7 % (ref 36.0–46.0)
Hemoglobin: 13.1 g/dL (ref 12.0–15.0)
MCHC: 33.9 g/dL (ref 30.0–36.0)
MCV: 93.1 fl (ref 78.0–100.0)
Platelets: 188 10*3/uL (ref 150.0–400.0)
RBC: 4.16 Mil/uL (ref 3.87–5.11)
RDW: 14.5 % (ref 11.5–15.5)
WBC: 4.2 10*3/uL (ref 4.0–10.5)

## 2021-08-11 LAB — COMPREHENSIVE METABOLIC PANEL
ALT: 8 U/L (ref 0–35)
AST: 17 U/L (ref 0–37)
Albumin: 4.8 g/dL (ref 3.5–5.2)
Alkaline Phosphatase: 55 U/L (ref 39–117)
BUN: 16 mg/dL (ref 6–23)
CO2: 31 mEq/L (ref 19–32)
Calcium: 9.9 mg/dL (ref 8.4–10.5)
Chloride: 99 mEq/L (ref 96–112)
Creatinine, Ser: 0.74 mg/dL (ref 0.40–1.20)
GFR: 71.14 mL/min (ref >60.00)
Glucose, Bld: 81 mg/dL (ref 70–99)
Potassium: 3.9 mEq/L (ref 3.5–5.1)
Sodium: 138 mEq/L (ref 135–145)
Total Bilirubin: 0.7 mg/dL (ref 0.2–1.2)
Total Protein: 7.2 g/dL (ref 6.0–8.3)

## 2021-08-11 LAB — T4, FREE: Free T4: 0.96 ng/dL (ref 0.60–1.60)

## 2021-08-11 LAB — LIPID PANEL
Cholesterol: 152 mg/dL (ref 0–200)
HDL: 48.8 mg/dL (ref >39.00)
LDL Cholesterol: 76 mg/dL (ref 0–99)
NonHDL: 103.38
Total CHOL/HDL Ratio: 3
Triglycerides: 136 mg/dL (ref 0.0–149.0)
VLDL: 27.2 mg/dL (ref 0.0–40.0)

## 2021-08-11 LAB — TSH: TSH: 7.61 u[IU]/mL — ABNORMAL HIGH (ref 0.35–5.50)

## 2021-08-11 LAB — VITAMIN B12: Vitamin B-12: 971 pg/mL — ABNORMAL HIGH (ref 211–911)

## 2021-08-11 MED ORDER — PANTOPRAZOLE SODIUM 20 MG PO TBEC: 20.0000 mg | DELAYED_RELEASE_TABLET | Freq: Every day | ORAL | 3 refills | Status: DC

## 2021-08-11 NOTE — Telephone Encounter (Signed)
Patient son Annette Stable returned call and stated you can call back at [phone]. Thank you.

## 2021-08-11 NOTE — Assessment & Plan Note (Signed)
I have personally reviewed the Medicare Annual Wellness questionnaire and have noted 1. The patient's medical and social history 2. Their use of alcohol, tobacco or illicit drugs 3. Their current medications and supplements 4. The patient's functional ability including ADL's, fall risks, home safety risks and hearing or visual             impairment. 5. Diet and physical activities 6. Evidence for depression or mood disorders  The patients weight, height, BMI and visual acuity have been recorded in the chart I have made referrals, counseling and provided education to the patient based review of the above and I have provided the pt with a written personalized care plan for preventive services.  I have provided you with a copy of your personalized plan for preventive services. Please take the time to review along with your updated medication list.  No cancer screening due to age Walks regularly Updated COVID and flu vaccines in the fall Td at pharmacy

## 2021-08-11 NOTE — Assessment & Plan Note (Addendum)
On imaging is on atorvastatin 10mg  and the plavix 75

## 2021-08-11 NOTE — Progress Notes (Signed)
Hearing Screening - Comments:: Has hearing aids. Wearing them today. Still has hearing problems. Vision Screening - Comments:: August 2022

## 2021-08-11 NOTE — Telephone Encounter (Signed)
Patient son called and stated would like to talk to [facility] about the office visit today. Call back number [phone].

## 2021-08-11 NOTE — Assessment & Plan Note (Signed)
Doing well with the mirtazapine 30mg  daily

## 2021-08-11 NOTE — Telephone Encounter (Signed)
Left message for pt's son

## 2021-08-11 NOTE — Assessment & Plan Note (Signed)
Does okay with gabapentin 800 daily (5 PM) On OTC B12

## 2021-08-11 NOTE — Assessment & Plan Note (Signed)
Seems euthyroid on levothyroxine 75

## 2021-08-11 NOTE — Assessment & Plan Note (Signed)
Quiet on the pantoprazole 40mg 

## 2021-08-11 NOTE — Assessment & Plan Note (Signed)
Discussed treatment--she gave verbal consent Liquid nitrogen 30 seconds x 2 Tolerated well Discussed care If recurs--needs excision with derm

## 2021-08-11 NOTE — Telephone Encounter (Signed)
Spoke to pt's son. Went ahead and sent the pantoprazole to [facility]. The gabapentin was sent to [facility] on the 17th. If they do not get that to her in the next few days, he will let me know and we can send it to [facility], also.

## 2021-08-11 NOTE — Progress Notes (Signed)
Subjective:    Patient ID: Nicole Ellison, female    DOB: 05-Mar-1928, 88 y.o.   MRN: [id number]  HPI Here for Medicare wellness visit and follow up of chronic health conditions With son Reviewed form and advanced directives Reviewed other doctors No hospitalizations or surgery Vision is okay New hearing aides are working some better No alcohol or tobacco Trying to walk daily still Walks with rollator Once a month cleaner---she does the rest of the housework Son takes her shopping (she does it herself)--she doesn't drive (or do steps) Prepared meals from the [employer] No falls No sig worrisome memory issues  Did have one ER visit in February Elevated BP, etc Seemed to be anxiety related Some depressed mood---"I talk myself through some of the things" Tries to talk to the social worker at [facility]  Sleeps well in general---mirtazapine/gabapentin 800 (5PM) Also on B12  No chest pain or SOB No dizziness or syncope No palpitations No edema No neurologic symptoms  Continues on pantoprazole No heartburn or dysphagia on that  Energy levels okay Weight stable  Current Outpatient Medications on File Prior to Visit  Medication Sig Dispense Refill   atorvastatin (LIPITOR) 10 MG tablet TAKE 1 TABLET DAILY 90 tablet 3   Calcium Carbonate (CALCIUM 600 PO) Take by mouth 2 (two) times daily.     clopidogrel (PLAVIX) 75 MG tablet Take 1 tablet (75 mg total) by mouth daily. 90 tablet 3   gabapentin (NEURONTIN) 400 MG capsule TAKE 2 CAPSULES AT BEDTIME 180 capsule 3   levothyroxine (SYNTHROID) 75 MCG tablet TAKE 1 TABLET DAILY BEFORE BREAKFAST 90 tablet 3   losartan (COZAAR) 25 MG tablet TAKE 1 TABLET DAILY 90 tablet 3   metoprolol succinate (TOPROL-XL) 25 MG 24 hr tablet TAKE 1 TABLET TWICE A DAY 180 tablet 3   mirtazapine (REMERON) 30 MG tablet TAKE 1 TABLET AT BEDTIME 90 tablet 3   ondansetron (ZOFRAN-ODT) 4 MG disintegrating tablet Take 1 tablet (4 mg total) by mouth every 8  (eight) hours as needed for nausea or vomiting. 12 tablet 0   pantoprazole (PROTONIX) 20 MG tablet TAKE 1 TABLET DAILY 90 tablet 3   vitamin B-12 (CYANOCOBALAMIN) 1000 MCG tablet Take 1,000 mcg by mouth daily.     No current facility-administered medications on file prior to visit.    Allergies  Allergen Reactions   Avelox [Moxifloxacin]    Levaquin [Levofloxacin]     Past Medical History:  Diagnosis Date   Carotid arterial disease (HCC)    GERD (gastroesophageal reflux disease)    Hyperlipidemia    Hypertension    Hypothyroidism    MDD (major depressive disorder), single episode, in full remission (HCC)    Polyneuropathy     Past Surgical History:  Procedure Laterality Date   ORIF DISTAL RADIUS FRACTURE     with redo twice   TOTAL HIP ARTHROPLASTY Right ~2011    Family History  Problem Relation Age of Onset   Alzheimer's disease Mother    Heart disease Father    Liver cancer Sister    Alzheimer's disease Brother     Social History   Socioeconomic History   Marital status: Widowed    Spouse name: Not on file   Number of children: 2   Years of education: Not on file   Highest education level: Not on file  Occupational History   Occupation: Bank [employer]    Comment: Retired 1995  Tobacco Use   Smoking status: Never  Smokeless tobacco: Never  Substance and Sexual Activity   Alcohol use: Not Currently   Drug use: Not on file   Sexual activity: Not on file  Other Topics Concern   Not on file  Social History Narrative   Has living will   Son Annette Stable is health care POA   Has DNR --confirmed   No tube feeds   Social Determinants of Health   Financial Resource Strain: Not on file  Food Insecurity: Not on file  Transportation Needs: Not on file  Physical Activity: Not on file  Stress: Not on file  Social Connections: Not on file  Intimate Partner Violence: Not on file   Review of Systems Wears seat belt Teeth "fair"---needed some recent work No  suspicious skin lesions---just slight red area on nose Bowels move fine---takes OTC med daily. No blood Voids fine---no incontinence No sig back or joint pains    Objective:   Physical Exam Constitutional:      Appearance: Normal appearance.  HENT:     Mouth/Throat:     Comments: No lesions Eyes:     Conjunctiva/sclera: Conjunctivae normal.     Pupils: Pupils are equal, round, and reactive to light.  Cardiovascular:     Rate and Rhythm: Normal rate and regular rhythm.     Pulses: Normal pulses.     Heart sounds: No murmur heard.    No gallop.  Pulmonary:     Effort: Pulmonary effort is normal.     Breath sounds: Normal breath sounds. No wheezing or rales.  Abdominal:     Palpations: Abdomen is soft.     Tenderness: There is no abdominal tenderness.  Musculoskeletal:     Cervical back: Neck supple.     Right lower leg: No edema.     Left lower leg: No edema.  Lymphadenopathy:     Cervical: No cervical adenopathy.  Skin:    Findings: No rash.     Comments: 60mm scaly lesion on left side of nose  Neurological:     General: No focal deficit present.     Mental Status: She is alert and oriented to person, place, and time.     Comments: Mini-cog normal  Psychiatric:        Mood and Affect: Mood normal.        Behavior: Behavior normal.            Assessment & Plan:

## 2021-08-11 NOTE — Assessment & Plan Note (Signed)
BP Readings from Last 3 Encounters:  08/11/21 118/78  03/08/21 (!) 150/73  02/22/21 (!) 172/86   Good control on losartan 25 and metoprol 25 daily

## 2021-08-12 ENCOUNTER — Telehealth: Payer: Self-pay

## 2021-08-12 NOTE — Telephone Encounter (Signed)
Opened in error

## 2021-09-03 ENCOUNTER — Telehealth: Payer: Self-pay | Admitting: Internal Medicine

## 2021-09-03 MED ORDER — CLOPIDOGREL BISULFATE 75 MG PO TABS: 75.0000 mg | ORAL_TABLET | Freq: Every day | ORAL | 3 refills | Status: AC

## 2021-09-03 NOTE — Telephone Encounter (Signed)
Called and spoke to pt's son. Advised the pantoprazole was sent last month. He will check with them. I did just send in the clopidogrel.

## 2021-09-03 NOTE — Telephone Encounter (Signed)
Pt needing refills at [facility] (NOT [facility])  Panpopriazole (Protonix)  Plavix generic

## 2021-09-14 ENCOUNTER — Telehealth: Payer: Self-pay | Admitting: Internal Medicine

## 2021-09-14 MED ORDER — METOPROLOL SUCCINATE ER 25 MG PO TB24: 25.0000 mg | ORAL_TABLET | Freq: Two times a day (BID) | ORAL | 3 refills | Status: AC

## 2021-09-14 MED ORDER — MIRTAZAPINE 30 MG PO TABS: 30.0000 mg | ORAL_TABLET | Freq: Every day | ORAL | 3 refills | Status: AC

## 2021-09-14 MED ORDER — ATORVASTATIN CALCIUM 10 MG PO TABS: 10.0000 mg | ORAL_TABLET | Freq: Every day | ORAL | 3 refills | Status: AC

## 2021-09-14 MED ORDER — LOSARTAN POTASSIUM 25 MG PO TABS: 25.0000 mg | ORAL_TABLET | Freq: Every day | ORAL | 3 refills | Status: DC

## 2021-09-14 MED ORDER — LEVOTHYROXINE SODIUM 75 MCG PO TABS: 75.0000 ug | ORAL_TABLET | Freq: Every day | ORAL | 3 refills | Status: DC

## 2021-09-14 NOTE — Telephone Encounter (Signed)
Rxs sent electronically.  

## 2021-09-14 NOTE — Telephone Encounter (Signed)
Caller Name: Bill(Patients son) Call back phone #: [phone]  MEDICATION(S): 1. mirtazapine (REMERON) 30 MG tablet 2. levothyroxine (SYNTHROID) 75 MCG tablet 3. metoprolol succinate (TOPROL-XL) 25 MG 24 hr tablet 4. losartan (COZAAR) 25 MG tablet 5. atorvastatin (LIPITOR) 10 MG tablet  Days of Med Remaining: a little of each  Has the patient contacted their pharmacy (YES/NO)? Patient is changing pharmacies What did pharmacy advise?  Preferred Pharmacy: [facility] in   ~~~Please advise patient/caregiver to allow 2-3 business days to process RX refills.

## 2021-10-11 ENCOUNTER — Telehealth: Payer: Self-pay | Admitting: Internal Medicine

## 2021-10-11 NOTE — Telephone Encounter (Signed)
Patient son Rush Landmark called in and wanted to speak with in regards to Nicole Ellison transitioning to the provider at [facility] when Dr. Silvio Pate leaves. He can be reached at [phone]. Thank you!

## 2021-10-11 NOTE — Telephone Encounter (Signed)
Spoke to him about the on campus [facility] clinic that [facility] will be running. She is planning to transition once their clinic is up and running.

## 2021-10-29 ENCOUNTER — Ambulatory Visit: Payer: Medicare Other | Admitting: Student

## 2021-10-29 ENCOUNTER — Encounter: Payer: Self-pay | Admitting: Student

## 2021-10-29 VITALS — BP 126/78 | HR 80 | Temp 97.8°F | Ht 68.0 in | Wt 153.0 lb

## 2021-10-29 DIAGNOSIS — G629 Polyneuropathy, unspecified: Secondary | ICD-10-CM

## 2021-10-29 DIAGNOSIS — N959 Unspecified menopausal and perimenopausal disorder: Secondary | ICD-10-CM | POA: Diagnosis not present

## 2021-10-29 DIAGNOSIS — F325 Major depressive disorder, single episode, in full remission: Secondary | ICD-10-CM

## 2021-10-29 DIAGNOSIS — K219 Gastro-esophageal reflux disease without esophagitis: Secondary | ICD-10-CM

## 2021-10-29 DIAGNOSIS — I10 Essential (primary) hypertension: Secondary | ICD-10-CM | POA: Diagnosis not present

## 2021-10-29 DIAGNOSIS — I6523 Occlusion and stenosis of bilateral carotid arteries: Secondary | ICD-10-CM

## 2021-10-29 DIAGNOSIS — Z66 Do not resuscitate: Secondary | ICD-10-CM | POA: Diagnosis not present

## 2021-10-29 DIAGNOSIS — E039 Hypothyroidism, unspecified: Secondary | ICD-10-CM

## 2021-10-29 DIAGNOSIS — E785 Hyperlipidemia, unspecified: Secondary | ICD-10-CM

## 2021-10-29 DIAGNOSIS — Z0000 Encounter for general adult medical examination without abnormal findings: Secondary | ICD-10-CM

## 2021-10-29 NOTE — Patient Instructions (Addendum)
Please stop taking Protonix 20 mg daily for acid reflux (heart burn). This can decrease your bone strength.   Remember to schedule your bone density imaging.   I (or Sherrie Mustache NP) will see you in 3 months!  Nicole Rand, MD, [facility] [phone]

## 2021-10-29 NOTE — Progress Notes (Signed)
Location:      Place of Service:     Provider:   Code Status: DNR  Goals of Care:     10/29/2021    1:22 PM  Advanced Directives  Does Patient Have a Medical Advance Directive? Yes  Type of Advance Directive Out of facility DNR (pink MOST or yellow form)  Does patient want to make changes to medical advance directive? No - Patient declined  Pre-existing out of facility DNR order (yellow form or pink MOST form) Yellow form placed in chart (order not valid for inpatient use)     Chief Complaint  Patient presents with   Establish Care    New patient to establish care at [facility]. Patient with ongoing neuropathy. Here with Annette Stable, Son.     HPI: Patient is a 88 y.o. female seen today for medical management of chronic diseases.   She has a history of depression. Has had some panic attacks in the past. Married to her husband for 66 years and he died in 05/07/2016. She likes to visit with people. She stays in the room most of the time. No place  Denies signs of bleeding. She sometimes has trouble with bowel movements take some senna and miralax for this. She doesn't really have loose stools from her treatment of constipatoin.   Urinating fine.   She uses a walker and a rollator-- walker is at night. She has a cleaning lady 1x per month. She doesn't cook, sometimes a small meal or microwave. Her son drives her everywhere. She walks the building rounds every day. She has some fear of falling. It has been over 1 year since her fall and that was in the apartment and tripped over something.   She is in an apartment. Before this she was at [employer]. She has been at [facility] for 2 years. Her husband was in the [place]. Lived a lot of places. They had 72 years of marriage. Many grand children.   She states she is DNR. Has a yellow sheet on her regrigerator.   HOH - ears cleaned once per month.Just had them cleaned this morning   Her son Annette Stable Is here for the appointment. Hearing  and eye sight have been stable. He is HCPOA. There is a daughter is Paul[place], [place] and not involved. Annette Stable states she has a lot more socialization. Takes all of her meals in her room. Oscar La continues to check in on her. He lives 5 minutes from here.   She has some aching in the legs at night. The   Carotid US 02/2021: IMPRESSION: No significant stenosis of internal carotid arteries.  Past Medical History:  Diagnosis Date   Carotid arterial disease (HCC)    GERD (gastroesophageal reflux disease)    Hyperlipidemia    Hypertension    Hypothyroidism    MDD (major depressive disorder), single episode, in full remission (HCC)    Polyneuropathy     Past Surgical History:  Procedure Laterality Date   ORIF DISTAL RADIUS FRACTURE     with redo twice   TOTAL HIP ARTHROPLASTY Right ~2011    Allergies  Allergen Reactions   Avelox [Moxifloxacin]    Levaquin [Levofloxacin]     Outpatient Encounter Medications as of 10/29/2021  Medication Sig   atorvastatin (LIPITOR) 10 MG tablet Take 1 tablet (10 mg total) by mouth daily.   Calcium Carbonate (CALCIUM 600 PO) Take by mouth 2 (two) times daily.   clopidogrel (PLAVIX) 75  MG tablet Take 1 tablet (75 mg total) by mouth daily.   gabapentin (NEURONTIN) 400 MG capsule TAKE 2 CAPSULES AT BEDTIME   levothyroxine (SYNTHROID) 75 MCG tablet Take 1 tablet (75 mcg total) by mouth daily before breakfast.   losartan (COZAAR) 25 MG tablet Take 1 tablet (25 mg total) by mouth daily.   metoprolol succinate (TOPROL-XL) 25 MG 24 hr tablet Take 1 tablet (25 mg total) by mouth 2 (two) times daily.   mirtazapine (REMERON) 30 MG tablet Take 1 tablet (30 mg total) by mouth at bedtime.   ondansetron (ZOFRAN-ODT) 4 MG disintegrating tablet Take 1 tablet (4 mg total) by mouth every 8 (eight) hours as needed for nausea or vomiting.   pantoprazole (PROTONIX) 20 MG tablet Take 1 tablet (20 mg total) by mouth daily.   UNABLE TO FIND Take 2 capsules by mouth daily. Med  Name: Formula 303   vitamin B-12 (CYANOCOBALAMIN) 1000 MCG tablet Take 1,000 mcg by mouth daily.   No facility-administered encounter medications on file as of 10/29/2021.    Review of Systems:  Review of Systems  Constitutional:  Negative for activity change and appetite change.  HENT:  Positive for hearing loss.   Respiratory:  Negative for chest tightness.   Cardiovascular:  Negative for chest pain.  Gastrointestinal:  Negative for abdominal distention and blood in stool.    Health Maintenance  Topic Date Due   TETANUS/TDAP  Never done   Pneumonia Vaccine 42+ Years old (1 - PCV) Never done   DEXA SCAN  Never done   COVID-19 Vaccine (5 - Moderna risk series) 08/06/2020   INFLUENZA VACCINE  Never done   Zoster Vaccines- Shingrix  Completed   HPV VACCINES  Aged Out    Physical Exam: Vitals:   10/29/21 1318  BP: 126/78  Pulse: 80  Temp: 97.8 F (36.6 C)  TempSrc: Temporal  SpO2: 98%  Weight: 153 lb (69.4 kg)  Height: 5\' 8"  (1.727 m)   Body mass index is 23.26 kg/m. Physical Exam  Labs reviewed: Basic Metabolic Panel: Recent Labs    02/22/21 1128 03/07/21 2042 08/11/21 0953  NA 136 135 138  K 4.1 3.5 3.9  CL 98 99 99  CO2 29 26 31   GLUCOSE 97 116* 81  BUN 13 18 16   CREATININE 0.78 0.66 0.74  CALCIUM 10.1 9.6 9.9  TSH  --   --  7.61*   Liver Function Tests: Recent Labs    02/22/21 1128 03/07/21 2042 08/11/21 0953  AST 17 22 17   ALT 8 13 8   ALKPHOS 57 52 55  BILITOT 0.7 0.6 0.7  PROT 7.2 7.3 7.2  ALBUMIN 4.7 4.6 4.8   Recent Labs    02/22/21 1128 03/07/21 2042  LIPASE 37.0 49  AMYLASE 57  --    No results for input(s): "AMMONIA" in the last 8760 hours. CBC: Recent Labs    02/22/21 1128 03/07/21 2042 08/11/21 0953  WBC 4.5 12.4* 4.2  NEUTROABS 2.0 10.0*  --   HGB 13.0 13.4 13.1  HCT 39.0 41.2 38.7  MCV 92.8 95.4 93.1  PLT 186.0 182 188.0   Lipid Panel: Recent Labs    08/11/21 0953  CHOL 152  HDL 48.80  LDLCALC 76  TRIG  136.0  CHOLHDL 3   No results found for: "HGBA1C"  Procedures since last visit: No results found.  Assessment/Plan 1. DNR (do not resuscitate) Patient maintains she would like to remain DNR.   2. Post menopausal problems  Preventative health care Patient has had screening for BMD, however, the results are unavailable for review and patient unsure of status. Given overall health and goals will plan to screen for osteoporosis and discuss treatment as indicated. Ordered BMD. Will discuss results when available.   3. Primary hypertension BP well controlled on current regimen with Losartan 25 mg daily. BMP July 2023 wnl. Will continue q3-6 mo labs based on status.   4. Bilateral carotid artery stenosis [place] results outlined above. Continue Plavix 75 mg daily.   5. Gastroesophageal reflux disease without esophagitis Discussed with patient she is not having symptoms. Will plan to trial discontinued pantoprazole given effect of long term use on bone health. Discuss further at follow up.   6. Hypothyroidism, unspecified type Patient's most recent TSH 7.6 which is in normal range for her age <8. Will continue levothyroxine 75 mcg daily.   7. Polyneuropathy Patient has significant pain in bilateral lower extremities due to neuropathy. Discussed possible decrease in dosing of gabapentin due to increased risk of fall and drowsiness. Patient does not have any issues at this time. Discussed that if there is a change in her renal function, this dose will need to be decreased to goal range.   8. Hyperlipidemia, unspecified hyperlipidemia type Lipids checked July 2023, within normal limits. Will continue atorvastatin 10 mg at this time.   9. MDD (major depressive disorder), single episode, in full remission [facility]) Patient becomes tearful when thinking about her late husband, however, she states in general mood is stable. Previously received support from LCSW since patient has been less interactive with  folks partly due to hearing loss. Will continue to monitor for symptoms. Continue Mirtazapine 30 mg daily at bedtime. Will consider SSRI at follow up appointment given first line therapy.     Labs/tests ordered:  * No order type specified * Next appt:  3 months with MD or NP     Coralyn Helling, MD, [facility] [phone]

## 2021-10-31 ENCOUNTER — Encounter: Payer: Self-pay | Admitting: Student

## 2021-11-01 ENCOUNTER — Encounter: Payer: Self-pay | Admitting: Student

## 2021-11-04 ENCOUNTER — Other Ambulatory Visit: Payer: Self-pay | Admitting: *Deleted

## 2021-11-04 DIAGNOSIS — N959 Unspecified menopausal and perimenopausal disorder: Secondary | ICD-10-CM

## 2021-11-24 ENCOUNTER — Encounter: Payer: Self-pay | Admitting: Student

## 2021-12-25 ENCOUNTER — Encounter: Payer: Self-pay | Admitting: Student

## 2021-12-27 MED ORDER — GABAPENTIN 400 MG PO CAPS: 800.0000 mg | ORAL_CAPSULE | Freq: Every day | ORAL | 1 refills | Status: DC

## 2022-01-03 ENCOUNTER — Encounter: Payer: Self-pay | Admitting: Student

## 2022-01-25 ENCOUNTER — Other Ambulatory Visit: Payer: Medicare Other

## 2022-03-06 ENCOUNTER — Encounter: Payer: Self-pay | Admitting: Student

## 2022-03-07 ENCOUNTER — Encounter: Payer: Self-pay | Admitting: Student

## 2022-03-07 ENCOUNTER — Ambulatory Visit: Payer: Medicare Other | Admitting: Student

## 2022-03-07 VITALS — BP 140/80 | HR 77 | Temp 98.4°F | Ht 68.0 in

## 2022-03-07 DIAGNOSIS — U071 COVID-19: Secondary | ICD-10-CM | POA: Diagnosis not present

## 2022-03-07 DIAGNOSIS — R0989 Other specified symptoms and signs involving the circulatory and respiratory systems: Secondary | ICD-10-CM

## 2022-03-07 LAB — POC COVID19 BINAXNOW: SARS Coronavirus 2 Ag: POSITIVE — AB

## 2022-03-07 MED ORDER — NIRMATRELVIR/RITONAVIR (PAXLOVID) TABLET (RENAL DOSING): 2.0000 | ORAL_TABLET | Freq: Two times a day (BID) | ORAL | 0 refills | Status: AC

## 2022-03-07 NOTE — Progress Notes (Signed)
Location:  [facility]    Place of Service:   [facility]  Provider: Unk Lightning  Code Status: DNR Goals of Care:     10/29/2021    1:22 PM  Advanced Directives  Does Patient Have a Medical Advance Directive? Yes  Type of Advance Directive Out of facility DNR (pink MOST or yellow form)  Does patient want to make changes to medical advance directive? No - Patient declined  Pre-existing out of facility DNR order (yellow form or pink MOST form) Yellow form placed in chart (order not valid for inpatient use)     Chief Complaint  Patient presents with   Acute Visit    Patient complains of coughing and sore throat. Patient had symptoms Saturday. No fever. Patient did fall,but with no injury. Patient does have chest congestion. Appetite is good. Patient took COVID test this morning,but was inconclusive. No Flu like symptoms. Breathing sounds labored. Patient drinking plenty of fluids. Not coughing up anything. Feeling more tired than usual. No chest tightness    HPI: Patient is a 89 y.o. female seen today for an acute visit for cold-like symptoms. She slid to the floor out of her bed. She was worried about her balance. Her son was with her yesterday. She hasn't had much of a  cough.   Past Medical History:  Diagnosis Date   Carotid arterial disease ([place])    GERD (gastroesophageal reflux disease)    Hyperlipidemia    Hypertension    Hypothyroidism    MDD (major depressive disorder), single episode, in full remission ([place])    Polyneuropathy     Past Surgical History:  Procedure Laterality Date   ORIF DISTAL RADIUS FRACTURE     with redo twice   TOTAL HIP ARTHROPLASTY Right ~2011    Allergies  Allergen Reactions   Avelox [Moxifloxacin]    Levaquin [Levofloxacin]     Outpatient Encounter Medications as of 03/07/2022  Medication Sig   atorvastatin (LIPITOR) 10 MG tablet Take 1 tablet (10 mg total) by mouth daily.   Calcium Carbonate (CALCIUM 600 PO) Take by mouth 2 (two)  times daily.   clopidogrel (PLAVIX) 75 MG tablet Take 1 tablet (75 mg total) by mouth daily.   gabapentin (NEURONTIN) 400 MG capsule Take 2 capsules (800 mg total) by mouth at bedtime.   levothyroxine (SYNTHROID) 75 MCG tablet Take 1 tablet (75 mcg total) by mouth daily before breakfast.   losartan (COZAAR) 25 MG tablet Take 1 tablet (25 mg total) by mouth daily.   metoprolol succinate (TOPROL-XL) 25 MG 24 hr tablet Take 1 tablet (25 mg total) by mouth 2 (two) times daily.   mirtazapine (REMERON) 30 MG tablet Take 1 tablet (30 mg total) by mouth at bedtime.   nirmatrelvir/ritonavir, renal dosing, (PAXLOVID) 10 x 150 MG & 10 x 100MG TABS Take 2 tablets by mouth 2 (two) times daily for 5 days. (Take nirmatrelvir 150 mg one tablet twice daily for 5 days and ritonavir 100 mg one tablet twice daily for 5 days) Patient GFR is 71   ondansetron (ZOFRAN-ODT) 4 MG disintegrating tablet Take 1 tablet (4 mg total) by mouth every 8 (eight) hours as needed for nausea or vomiting.   pantoprazole (PROTONIX) 20 MG tablet Take 1 tablet (20 mg total) by mouth daily.   UNABLE TO FIND Take 2 capsules by mouth daily. Med Name: Formula 303   vitamin B-12 (CYANOCOBALAMIN) 1000 MCG tablet Take 1,000 mcg by mouth daily.   No  facility-administered encounter medications on file as of 03/07/2022.    Review of Systems:  Review of Systems  Health Maintenance  Topic Date Due   DTaP/Tdap/Td (1 - Tdap) Never done   DEXA SCAN  Never done   COVID-19 Vaccine (5 - 2023-24 season) 09/24/2021   INFLUENZA VACCINE  04/24/2022 (Originally 08/24/2021)   Medicare Annual Wellness (AWV)  08/12/2022   Pneumonia Vaccine 60+ Years old  Completed   Zoster Vaccines- Shingrix  Completed   HPV VACCINES  Aged Out    Physical Exam: Vitals:   03/07/22 1424  BP: (!) 140/82  Pulse: 77  Temp: 98.4 F (36.9 C)  SpO2: 96%  Height: 5' 8"$  (1.727 m)   Body mass index is 23.26 kg/m. Physical Exam Constitutional:      Appearance: Normal  appearance.  Cardiovascular:     Rate and Rhythm: Normal rate and regular rhythm.  Pulmonary:     Effort: Pulmonary effort is normal.     Breath sounds: Rhonchi present.  Abdominal:     General: Abdomen is flat.     Palpations: Abdomen is soft.  Skin:    General: Skin is warm and dry.  Neurological:     Mental Status: She is alert and oriented to person, place, and time.     Labs reviewed: Basic Metabolic Panel: Recent Labs    03/07/21 2042 08/11/21 0953  NA 135 138  K 3.5 3.9  CL 99 99  CO2 26 31  GLUCOSE 116* 81  BUN 18 16  CREATININE 0.66 0.74  CALCIUM 9.6 9.9  TSH  --  7.61*   Liver Function Tests: Recent Labs    03/07/21 2042 08/11/21 0953  AST 22 17  ALT 13 8  ALKPHOS 52 55  BILITOT 0.6 0.7  PROT 7.3 7.2  ALBUMIN 4.6 4.8   Recent Labs    03/07/21 2042  LIPASE 49   No results for input(s): "AMMONIA" in the last 8760 hours. CBC: Recent Labs    03/07/21 2042 08/11/21 0953  WBC 12.4* 4.2  NEUTROABS 10.0*  --   HGB 13.4 13.1  HCT 41.2 38.7  MCV 95.4 93.1  PLT 182 188.0   Lipid Panel: Recent Labs    08/11/21 0953  CHOL 152  HDL 48.80  LDLCALC 76  TRIG 136.0  CHOLHDL 3   No results found for: "HGBA1C"  Procedures since last visit: No results found.  Assessment/Plan COVID-19 virus infection - Plan: nirmatrelvir/ritonavir, renal dosing, (PAXLOVID) 10 x 150 MG & 10 x 100MG TABS  Upper respiratory symptom - Plan: POC COVID-19 Patient with rapid test for COVID positive. Discussed treatment with paxlovid and OTC medications to use and avoid. F/u PRN. RVP ordered as well.    Labs/tests ordered:  * No order type specified * Next appt:  Visit date not found

## 2022-03-07 NOTE — Patient Instructions (Addendum)
Please take Paxlovid for the next 5 days.   Please quarantine for 10 days.   Please do not take Atorvastatin or Plavix while on Paxlovid treatment  Some recommended over the counter medications for this:  Tylenol 1000 mg every 8 hours Mucinex every 6 hours  Dayquil every 6 hours   PLEASE AVOID:  Nyquil  Sinex  Coricidin

## 2022-03-08 ENCOUNTER — Telehealth: Payer: Self-pay

## 2022-03-08 NOTE — Telephone Encounter (Signed)
Merlene Morse, independent nurse for [facility] had a verbal conversation with me stating she received an email about labs/RVP(RSV) test sent in yesterday on [facility] and it sates "Specimen VM unneeded"  I called [facility], spoke with Ailene Ravel who was unable to advise on what "Specimen VM unneeded" means or what concerns need to be addressed in order for patients labs to be processed.   Meredith transferred me to another representative who asked if specimen was refrigerated prior to [facility] pick-up, as it was received at room temperature (which is unacceptable).   Merlene Morse was over hearing the conversation and stated that RVP container is at room temp initially, specimen is collected from patient and placed in the frig for [facility] driver to pick up. I asked the representative what is the standard of work for the [employer] driver as they would have to put it on ice for it to remain cold and she agreed that should be the process however she would have to look into what actually happened.  The [facility] rep then asked again if specimen was in the frig and not in an outside lockbox. I informed her that we do not have a lock box here at [facility], so it had to be in the frig, however I was not the one who collected and handled the specimen. The representative took my number and stated she will look into this situation and call me tomorrow at the office.  I will share this incident with Dr.Beamer and the Medical Assistant involved and provide additional information once it is provided to me.

## 2022-03-09 NOTE — Telephone Encounter (Signed)
Sample was refrigerated.

## 2022-03-09 NOTE — Telephone Encounter (Signed)
Received call from [facility] with [facility] and she concluded that test would need to be recollected, refrigerated, and an indication should be on the sample for the [facility] driver that it needs to be on ice to keep cold.  Maudie Mercury stated that they are looking into whether or not this standard of work was followed on their end, however we would need to recollect in the meantime.  Kim stated we can expect a call from [employer] later today.  Message will be forwarded to the [facility] team to take further action.

## 2022-03-09 NOTE — Telephone Encounter (Signed)
Below is Dr.Beamer's response:   [name], MD  Logan Bores, CMA Caller: Unspecified Wilburn Mylar,  3:51 PM) Thank you so much for your work and investigation into this issue. This has been an unfortunate occurrence that has happened on multiple occasions and we are working towards a solution. I do not think this lab needs to be reprocessed as we had a positive rapid COVID test and initiated therapy. Thank you for helping highlight this ongoing issue.  VB    Left message on voicemail for patient to return call when available . Reason for call was to explain this situation and Dr.Beamer's response. Awaiting return call

## 2022-03-10 NOTE — Telephone Encounter (Signed)
Another outgoing call was placed to patient. No answer.

## 2022-03-10 NOTE — Telephone Encounter (Signed)
Spoke with Vickie, Insurance claims handler and she explained that this was a courier issue, however the bag has to be marked as refrigerated for the couriers are not initially trained on which specimen types are to remain refrigerated and which ones are not.  Vickie assured me that they are doing some training with the couriers and will emphasize going forward that if they get it out of the refrigerator to keep it cold during transport. Vickie apologized for this incident.  Final follow through will be shared with the [facility] team and no further action is required at this time, other than patient being notified that test will not be processed and Dr.Beamer has advised that we will not recollect

## 2022-03-29 ENCOUNTER — Encounter: Payer: Self-pay | Admitting: Nurse Practitioner

## 2022-03-29 ENCOUNTER — Encounter: Payer: Self-pay | Admitting: Internal Medicine

## 2022-03-29 ENCOUNTER — Ambulatory Visit: Payer: Medicare Other | Admitting: Nurse Practitioner

## 2022-03-29 VITALS — BP 136/90 | HR 63 | Temp 97.6°F | Resp 20

## 2022-03-29 DIAGNOSIS — K219 Gastro-esophageal reflux disease without esophagitis: Secondary | ICD-10-CM

## 2022-03-29 DIAGNOSIS — F325 Major depressive disorder, single episode, in full remission: Secondary | ICD-10-CM

## 2022-03-29 DIAGNOSIS — K5904 Chronic idiopathic constipation: Secondary | ICD-10-CM

## 2022-03-29 DIAGNOSIS — I10 Essential (primary) hypertension: Secondary | ICD-10-CM

## 2022-03-29 DIAGNOSIS — G629 Polyneuropathy, unspecified: Secondary | ICD-10-CM | POA: Diagnosis not present

## 2022-03-29 DIAGNOSIS — E785 Hyperlipidemia, unspecified: Secondary | ICD-10-CM

## 2022-03-29 DIAGNOSIS — E039 Hypothyroidism, unspecified: Secondary | ICD-10-CM

## 2022-03-29 NOTE — Progress Notes (Unsigned)
Careteam: Patient Care Team: Nicole Shorter, MD as PCP - General (Family Medicine)  Advanced Directive information Does Patient Have a Medical Advance Directive?: Yes, Type of Advance Directive: Out of facility DNR (pink MOST or yellow form);Healthcare Power of Attorney, Pre-existing out of facility DNR order (yellow form or pink MOST form): Yellow form placed in chart (order not valid for inpatient use), Does patient want to make changes to medical advance directive?: No - Patient declined  Allergies  Allergen Reactions   Avelox [Moxifloxacin]    Levaquin [Levofloxacin]     Chief Complaint  Patient presents with   Medicare Wellness    Annual wellness visit. Discuss need for td/tdap and additional covid boosters or post pone if patient refuses or is not a candidate. Here with son Nicole Ellison.      HPI: Patient is a 89 y.o. female seen in today at the [facility] for routine follow up.  She saw Dr Unk Lightning established care with patient in October.  Had covid last month and saw Dr Unk Lightning- she is feels well, had very mild symptoms.   Pain well controlled on gabapentin.  No chest pains or palpitations. No shortness of breath, cough or congestion.   Uses walker for mobility.   She feel a few weeks ago- was in the bedroom, no injury. Got off balance. Now using walker more often.   GERD- controlled on protonix  Reports constipation, she has OTC stool softener/laxative.   Sleeps well.   Mood disorder- anxiety/depression- reports she is better than she was. Her husband passed away 5 years ago tomorrow. She has done counseling but not helpful in the past.   Does exercises at home, she likes to stay active and walk.   Review of Systems:  Review of Systems  Constitutional:  Negative for chills, fever and weight loss.  HENT:  Positive for hearing loss. Negative for tinnitus.   Respiratory:  Negative for cough, sputum production and shortness of breath.   Cardiovascular:   Negative for chest pain, palpitations and leg swelling.  Gastrointestinal:  Negative for abdominal pain, constipation, diarrhea and heartburn.  Genitourinary:  Negative for dysuria, frequency and urgency.  Musculoskeletal:  Negative for back pain, falls, joint pain and myalgias.  Skin: Negative.   Neurological:  Negative for dizziness and headaches.  Psychiatric/Behavioral:  Negative for depression and memory loss. The patient does not have insomnia.     Past Medical History:  Diagnosis Date   Carotid arterial disease ([place])    GERD (gastroesophageal reflux disease)    Hyperlipidemia    Hypertension    Hypothyroidism    MDD (major depressive disorder), single episode, in full remission ([place])    Polyneuropathy    Past Surgical History:  Procedure Laterality Date   ORIF DISTAL RADIUS FRACTURE     with redo twice   TOTAL HIP ARTHROPLASTY Right ~2011   Social History:   reports that she has never smoked. She has never used smokeless tobacco. She reports that she does not currently use alcohol. She reports that she does not use drugs.  Family History  Problem Relation Age of Onset   Alzheimer's disease Mother    Heart disease Father    Liver cancer Sister    Alzheimer's disease Brother     Medications: Patient's Medications  New Prescriptions   No medications on file  Previous Medications   ATORVASTATIN (LIPITOR) 10 MG TABLET    Take 1 tablet (10 mg total) by mouth daily.  CALCIUM CARBONATE (CALCIUM 600 PO)    Take by mouth 2 (two) times daily.   CLOPIDOGREL (PLAVIX) 75 MG TABLET    Take 1 tablet (75 mg total) by mouth daily.   GABAPENTIN (NEURONTIN) 400 MG CAPSULE    Take 2 capsules (800 mg total) by mouth at bedtime.   LEVOTHYROXINE (SYNTHROID) 75 MCG TABLET    Take 1 tablet (75 mcg total) by mouth daily before breakfast.   LOSARTAN (COZAAR) 25 MG TABLET    Take 1 tablet (25 mg total) by mouth daily.   METOPROLOL SUCCINATE (TOPROL-XL) 25 MG 24 HR TABLET    Take 1 tablet  (25 mg total) by mouth 2 (two) times daily.   MIRTAZAPINE (REMERON) 30 MG TABLET    Take 1 tablet (30 mg total) by mouth at bedtime.   ONDANSETRON (ZOFRAN-ODT) 4 MG DISINTEGRATING TABLET    Take 1 tablet (4 mg total) by mouth every 8 (eight) hours as needed for nausea or vomiting.   PANTOPRAZOLE (PROTONIX) 20 MG TABLET    Take 1 tablet (20 mg total) by mouth daily.   UNABLE TO FIND    Take 2 capsules by mouth daily. Med Name: Formula 303   VITAMIN B-12 (CYANOCOBALAMIN) 1000 MCG TABLET    Take 1,000 mcg by mouth daily.  Modified Medications   No medications on file  Discontinued Medications   No medications on file    Physical Exam:  Vitals:   03/29/22 1133 03/29/22 1158  BP: (!) 138/100 (!) 136/90  Pulse: 63   Resp: 20   Temp: 97.6 F (36.4 C)   SpO2: 98%    There is no height or weight on file to calculate BMI. Wt Readings from Last 3 Encounters:  10/29/21 153 lb (69.4 kg)  08/11/21 153 lb (69.4 kg)  03/07/21 167 lb 14.4 oz (76.2 kg)    Physical Exam Constitutional:      General: She is not in acute distress.    Appearance: She is well-developed. She is not diaphoretic.  HENT:     Head: Normocephalic and atraumatic.     Right Ear: External ear normal.     Left Ear: External ear normal.     Nose: Nose normal.     Mouth/Throat:     Pharynx: No oropharyngeal exudate.  Eyes:     Conjunctiva/sclera: Conjunctivae normal.     Pupils: Pupils are equal, round, and reactive to light.  Cardiovascular:     Rate and Rhythm: Normal rate and regular rhythm.     Heart sounds: Normal heart sounds.  Pulmonary:     Effort: Pulmonary effort is normal.     Breath sounds: Normal breath sounds.  Abdominal:     General: Bowel sounds are normal.     Palpations: Abdomen is soft.  Musculoskeletal:     Cervical back: Normal range of motion and neck supple.     Right lower leg: No edema.     Left lower leg: No edema.  Skin:    General: Skin is warm and dry.  Neurological:     Mental  Status: She is alert.  Psychiatric:        Mood and Affect: Mood normal.     Labs reviewed: Basic Metabolic Panel: Recent Labs    08/11/21 0953  NA 138  K 3.9  CL 99  CO2 31  GLUCOSE 81  BUN 16  CREATININE 0.74  CALCIUM 9.9  TSH 7.61*   Liver Function Tests: Recent Labs  08/11/21 0953  AST 17  ALT 8  ALKPHOS 55  BILITOT 0.7  PROT 7.2  ALBUMIN 4.8   No results for input(s): "LIPASE", "AMYLASE" in the last 8760 hours. No results for input(s): "AMMONIA" in the last 8760 hours. CBC: Recent Labs    08/11/21 0953  WBC 4.2  HGB 13.1  HCT 38.7  MCV 93.1  PLT 188.0   Lipid Panel: Recent Labs    08/11/21 0953  CHOL 152  HDL 48.80  LDLCALC 76  TRIG 136.0  CHOLHDL 3   TSH: Recent Labs    08/11/21 0953  TSH 7.61*   A1C: No results found for: "HGBA1C"   Assessment/Plan 1. Primary hypertension --Blood pressure elevated today but typically well controlled -No changes to medications today  -will have pt continue to monitor home bp goal <140/90, to notify if readings remain high on 3 different days  -follow metabolic panel - Complete Metabolic Panel with eGFR - CBC with Differential/Platelet  2. Gastroesophageal reflux disease without esophagitis -stable, continues on protonix 20 mg daily  3. Hypothyroidism, unspecified type -continues on synthroid 75 mcg daily - TSH  4. Polyneuropathy Stable, pain controlled with gabapentin  5. Hyperlipidemia, unspecified hyperlipidemia type -continues on lipitor with dietary modifications.  - Lipid panel  6. MDD (major depressive disorder), single episode, in full remission ([place]) -continues on remeron 30 mg daily at bedtime, reports overall mood is stable. She has a good support system with family.  7. Chronic idiopathic constipation Controlled on OTC regimen.    Next appt: 08/18/2022 for AWV Melodie Ashworth K. [place], [facility] [phone]

## 2022-03-29 NOTE — Patient Instructions (Addendum)
This Thursday 3/7 lab will come to your home to get your blood work

## 2022-04-01 LAB — CBC WITH DIFFERENTIAL/PLATELET
Absolute Monocytes: 455 cells/uL (ref 200–950)
Basophils Absolute: 0 cells/uL (ref 0–200)
Basophils Relative: 0 %
Eosinophils Absolute: 20 cells/uL (ref 15–500)
Eosinophils Relative: 0.4 %
HCT: 37.8 % (ref 35.0–45.0)
Hemoglobin: 12.4 g/dL (ref 11.7–15.5)
Lymphs Abs: 2190 cells/uL (ref 850–3900)
MCH: 31 pg (ref 27.0–33.0)
MCHC: 32.8 g/dL (ref 32.0–36.0)
MCV: 94.5 fL (ref 80.0–100.0)
MPV: 9.8 fL (ref 7.5–12.5)
Monocytes Relative: 9.1 %
Neutro Abs: 2335 cells/uL (ref 1500–7800)
Neutrophils Relative %: 46.7 %
Platelets: 166 10*3/uL (ref 140–400)
RBC: 4 10*6/uL (ref 3.80–5.10)
RDW: 13.5 % (ref 11.0–15.0)
Total Lymphocyte: 43.8 %
WBC: 5 10*3/uL (ref 3.8–10.8)

## 2022-04-01 LAB — COMPLETE METABOLIC PANEL WITH GFR
AG Ratio: 1.7 (calc) (ref 1.0–2.5)
ALT: 8 U/L (ref 6–29)
AST: 14 U/L (ref 10–35)
Albumin: 4.7 g/dL (ref 3.6–5.1)
Alkaline phosphatase (APISO): 49 U/L (ref 37–153)
BUN: 14 mg/dL (ref 7–25)
CO2: 30 mmol/L (ref 20–32)
Calcium: 10 mg/dL (ref 8.6–10.4)
Chloride: 97 mmol/L — ABNORMAL LOW (ref 98–110)
Creat: 0.81 mg/dL (ref 0.60–0.95)
Globulin: 2.7 g/dL (calc) (ref 1.9–3.7)
Glucose, Bld: 102 mg/dL — ABNORMAL HIGH (ref 65–99)
Potassium: 3.8 mmol/L (ref 3.5–5.3)
Sodium: 134 mmol/L — ABNORMAL LOW (ref 135–146)
Total Bilirubin: 0.8 mg/dL (ref 0.2–1.2)
Total Protein: 7.4 g/dL (ref 6.1–8.1)
eGFR: 68 mL/min/{1.73_m2} (ref >=60)

## 2022-04-01 LAB — LIPID PANEL
Cholesterol: 154 mg/dL (ref <200)
HDL: 54 mg/dL (ref >=50)
LDL Cholesterol (Calc): 79 mg/dL (calc)
Non-HDL Cholesterol (Calc): 100 mg/dL (calc) (ref <130)
Total CHOL/HDL Ratio: 2.9 (calc) (ref <5.0)
Triglycerides: 109 mg/dL (ref <150)

## 2022-04-01 LAB — TSH: TSH: 8.52 mIU/L — ABNORMAL HIGH (ref 0.40–4.50)

## 2022-05-06 ENCOUNTER — Encounter: Payer: Self-pay | Admitting: Student

## 2022-06-21 ENCOUNTER — Other Ambulatory Visit: Payer: Self-pay | Admitting: Student

## 2022-06-24 ENCOUNTER — Telehealth: Payer: Self-pay | Admitting: Student

## 2022-06-24 ENCOUNTER — Non-Acute Institutional Stay: Payer: Medicare Other | Admitting: Student

## 2022-06-24 ENCOUNTER — Encounter: Payer: Self-pay | Admitting: Student

## 2022-06-24 DIAGNOSIS — Z66 Do not resuscitate: Secondary | ICD-10-CM

## 2022-06-24 DIAGNOSIS — I10 Essential (primary) hypertension: Secondary | ICD-10-CM | POA: Diagnosis not present

## 2022-06-24 DIAGNOSIS — K219 Gastro-esophageal reflux disease without esophagitis: Secondary | ICD-10-CM

## 2022-06-24 DIAGNOSIS — F325 Major depressive disorder, single episode, in full remission: Secondary | ICD-10-CM

## 2022-06-24 DIAGNOSIS — I779 Disorder of arteries and arterioles, unspecified: Secondary | ICD-10-CM

## 2022-06-24 DIAGNOSIS — E039 Hypothyroidism, unspecified: Secondary | ICD-10-CM | POA: Diagnosis not present

## 2022-06-24 MED ORDER — LOSARTAN POTASSIUM 100 MG PO TABS: 100.0000 mg | ORAL_TABLET | Freq: Every day | ORAL | Status: AC

## 2022-06-24 NOTE — Progress Notes (Signed)
Provider:  Dr. Earnestine Mealing Location:  Other [place].  Nursing Home Room Number: [address] Place of Service:  ALF (13)  PCP: Earnestine Mealing, MD Patient Care Team: Earnestine Mealing, MD as PCP - General (Family Medicine)  Extended Emergency Contact Information Primary Emergency Contact: [place],Bill Mobile Phone: [phone] Relation: Son  Code Status: DNR Goals of Care: Advanced Directive information    06/24/2022    2:46 PM  Advanced Directives  Does Patient Have a Medical Advance Directive? Yes  Type of Advance Directive Out of facility DNR (pink MOST or yellow form);Healthcare Power of Attorney  Does patient want to make changes to medical advance directive? No - Patient declined  Copy of Healthcare Power of Attorney in Chart? No - copy requested      Chief Complaint  Patient presents with   Admission    Admission.     HPI: Patient is a 89 y.o. female seen today for admission to [facility] assisted Living. Patient was previously in independent living. Patient transitioned for additional medication support. She continues to ambulate with a walker. She walks to the dining room for breakfast and dinner. She often makes sandwiches for herself for lunch.   Her son Annette Stable is heavily involved in her care and is typically present.   She has had elevated blood pressure  She has had to go to the bathroom more often today.   Past Medical History:  Diagnosis Date   Carotid arterial disease (HCC)    GERD (gastroesophageal reflux disease)    Hyperlipidemia    Hypertension    Hypothyroidism    MDD (major depressive disorder), single episode, in full remission (HCC)    Polyneuropathy    Past Surgical History:  Procedure Laterality Date   ORIF DISTAL RADIUS FRACTURE     with redo twice   TOTAL HIP ARTHROPLASTY Right ~2011    reports that she has never smoked. She has never used smokeless tobacco. She reports that she does not currently use alcohol. She  reports that she does not use drugs. Social History   Socioeconomic History   Marital status: Widowed    Spouse name: Not on file   Number of children: 2   Years of education: Not on file   Highest education level: Not on file  Occupational History   Occupation: Bank Teller    Comment: Retired 1995  Tobacco Use   Smoking status: Never   Smokeless tobacco: Never  Vaping Use   Vaping Use: Never used  Substance and Sexual Activity   Alcohol use: Not Currently   Drug use: Never   Sexual activity: Not on file  Other Topics Concern   Not on file  Social History Narrative   Has living will   Son Annette Stable is health care POA   Has DNR --confirmed   No tube feeds   Social Determinants of Health   Financial Resource Strain: Not on file  Food Insecurity: Not on file  Transportation Needs: Not on file  Physical Activity: Not on file  Stress: Not on file  Social Connections: Not on file  Intimate Partner Violence: Not on file    Functional Status Survey:    Family History  Problem Relation Age of Onset   Alzheimer's disease Mother    Heart disease Father    Liver cancer Sister    Alzheimer's disease Brother     Health Maintenance  Topic Date Due   DTaP/Tdap/Td (1 - Tdap) Never done  DEXA SCAN  Never done   COVID-19 Vaccine (5 - 2023-24 season) 09/24/2021   Medicare Annual Wellness (AWV)  08/12/2022   INFLUENZA VACCINE  08/25/2022   Pneumonia Vaccine 34+ Years old  Completed   Zoster Vaccines- Shingrix  Completed   HPV VACCINES  Aged Out    Allergies  Allergen Reactions   Avelox [Moxifloxacin]    Levaquin [Levofloxacin]     Outpatient Encounter Medications as of 06/24/2022  Medication Sig   acetaminophen (TYLENOL) 325 MG tablet Take 650 mg by mouth every 4 (four) hours as needed.   aluminum-magnesium hydroxide 200-200 MG/5ML suspension Take 5 mLs by mouth every 4 (four) hours as needed for indigestion.   atorvastatin (LIPITOR) 10 MG tablet Take 1 tablet (10 mg  total) by mouth daily.   bismuth subsalicylate (PEPTO BISMOL) 262 MG/15ML suspension Take 30 mLs by mouth as needed.   Calcium Carbonate (CALCIUM 600 PO) Take by mouth daily.   carbamide peroxide (DEBROX) 6.5 % OTIC solution Place 5 drops into both ears as needed.   cetirizine (ZYRTEC) 5 MG tablet Take 5 mg by mouth daily as needed for allergies.   clopidogrel (PLAVIX) 75 MG tablet Take 1 tablet (75 mg total) by mouth daily.   dextromethorphan-guaiFENesin (TUSSIN DM) 10-100 MG/5ML liquid Take 5 mLs by mouth every 4 (four) hours as needed for cough.   dextrose (GLUTOSE) 40 % GEL Take 1 Tube by mouth as needed for low blood sugar.   gabapentin (NEURONTIN) 400 MG capsule TAKE TWO CAPSULES BY MOUTH AT BEDTIME   levothyroxine (SYNTHROID) 75 MCG tablet Take 1 tablet (75 mcg total) by mouth daily before breakfast.   losartan (COZAAR) 25 MG tablet Take 1 tablet (25 mg total) by mouth daily.   magnesium hydroxide (MILK OF MAGNESIA) 400 MG/5ML suspension Take 5 mLs by mouth daily as needed for mild constipation.   metoprolol succinate (TOPROL-XL) 25 MG 24 hr tablet Take 1 tablet (25 mg total) by mouth 2 (two) times daily.   mirtazapine (REMERON) 30 MG tablet Take 1 tablet (30 mg total) by mouth at bedtime.   nystatin (MYCOSTATIN/NYSTOP) powder Apply 1 Application topically. To excoriated area as needed twice daily.   ondansetron (ZOFRAN-ODT) 4 MG disintegrating tablet Take 1 tablet (4 mg total) by mouth every 8 (eight) hours as needed for nausea or vomiting.   OXYGEN 2lpm for dyspnea or SOB   vitamin B-12 (CYANOCOBALAMIN) 1000 MCG tablet Take 1,000 mcg by mouth daily.   [DISCONTINUED] pantoprazole (PROTONIX) 20 MG tablet Take 1 tablet (20 mg total) by mouth daily.   [DISCONTINUED] UNABLE TO FIND Take 2 capsules by mouth daily. Med Name: Formula 303   No facility-administered encounter medications on file as of 06/24/2022.    Review of Systems  Vitals:   06/24/22 1432  BP: (!) 196/93  Pulse: 80   Resp: (!) 22  Temp: 98.9 F (37.2 C)  SpO2: 95%  Weight: 153 lb 12.8 oz (69.8 kg)  Height: 5\' 8"  (1.727 m)   Body mass index is 23.39 kg/m. Physical Exam Constitutional:      Appearance: Normal appearance.  Abdominal:     General: Abdomen is flat. Bowel sounds are normal.     Palpations: Abdomen is soft.  Skin:    General: Skin is warm and dry.     Capillary Refill: Capillary refill takes less than 2 seconds.  Neurological:     General: No focal deficit present.     Mental Status: She is alert and  oriented to person, place, and time.     Cranial Nerves: No cranial nerve deficit.     Sensory: No sensory deficit.     Motor: No weakness.     Coordination: Coordination normal.     Gait: Gait normal.     Deep Tendon Reflexes: Reflexes normal.     Comments: 5/5 strength of bilateral upper and lower extremities bilater 4/5 grip strength. Symmetric CN 3-12.      Labs reviewed: Basic Metabolic Panel: Recent Labs    08/11/21 0953 03/31/22 0851  NA 138 134*  K 3.9 3.8  CL 99 97*  CO2 31 30  GLUCOSE 81 102*  BUN 16 14  CREATININE 0.74 0.81  CALCIUM 9.9 10.0   Liver Function Tests: Recent Labs    08/11/21 0953 03/31/22 0851  AST 17 14  ALT 8 8  ALKPHOS 55  --   BILITOT 0.7 0.8  PROT 7.2 7.4  ALBUMIN 4.8  --    No results for input(s): "LIPASE", "AMYLASE" in the last 8760 hours. No results for input(s): "AMMONIA" in the last 8760 hours. CBC: Recent Labs    08/11/21 0953 03/31/22 0851  WBC 4.2 5.0  NEUTROABS  --  2,335  HGB 13.1 12.4  HCT 38.7 37.8  MCV 93.1 94.5  PLT 188.0 166   Cardiac Enzymes: No results for input(s): "CKTOTAL", "CKMB", "CKMBINDEX", "TROPONINI" in the last 8760 hours. BNP: Invalid input(s): "POCBNP" No results found for: "HGBA1C" Lab Results  Component Value Date   TSH 8.52 (H) 03/31/2022   Lab Results  Component Value Date   VITAMINB12 971 (H) 08/11/2021   No results found for: "FOLATE" No results found for: "IRON", "TIBC",  "FERRITIN"  Imaging and Procedures obtained prior to SNF admission: US Carotid Bilateral  Result Date: 03/19/2021 CLINICAL DATA:  Hypertension Visual disturbance Hyperlipidemia EXAM: BILATERAL CAROTID DUPLEX ULTRASOUND TECHNIQUE: Wallace Cullens scale imaging, color Doppler and duplex ultrasound were performed of bilateral carotid and vertebral arteries in the neck. COMPARISON:  None. FINDINGS: Criteria: Quantification of carotid stenosis is based on velocity parameters that correlate the residual internal carotid diameter with NASCET-based stenosis levels, using the diameter of the distal internal carotid lumen as the denominator for stenosis measurement. The following velocity measurements were obtained: RIGHT ICA: 114/19 cm/sec CCA: 39/6 cm/sec SYSTOLIC ICA/CCA RATIO:  2.9 ECA: 102 cm/sec LEFT ICA: 99/43 cm/sec CCA: 50/8 cm/sec SYSTOLIC ICA/CCA RATIO:  2.0 ECA: 84 cm/sec RIGHT CAROTID ARTERY: Minimal calcified plaque of the distal common carotid artery. RIGHT VERTEBRAL ARTERY:  Antegrade flow. LEFT CAROTID ARTERY:  Mild calcified plaque of the ICA origin. LEFT VERTEBRAL ARTERY:  Antegrade flow. IMPRESSION: No significant stenosis of internal carotid arteries. Electronically Signed   By: Acquanetta Belling M.D.   On: 03/19/2021 08:06    Assessment/Plan 1. Primary hypertension BP elevated today. Last 2 appointments BP increasing, however, at this time very elevated. Patient states she feels like her normal self. Denies HA,CP,SOB,Dizziness,Changes in vision. Plan to increase dose of losartan to 100 mg daily. Additioanl dose of 25 mg given today as well. CNA available 24/7, however, given situation of no RN or LPN, cannot make judgement decision for PRN BP medication control. Will defer PRN at this time. Continue metoprolol 25 mg BID.   2. Gastroesophageal reflux disease without esophagitis Symptoms well-controlled.   3. MDD (major depressive disorder), single episode, in full remission (HCC) Simptoms well-controlled  at this time. Continue mirtazapine 30 mg nightly.   4. Hypothyroidism, unspecified type TSH recently elevated.  Recheck level at this time and adjust levothyroxine as indicated.   6. DNR (do not resuscitate) Patient maintains DNR status.   7. Carotid artery disease, unspecified laterality, unspecified type (HCC) Continue atorvastatin and plavix   Family/ staff Communication: Sent message to family, discussed plan with nursing  Labs/tests ordered: CBC, CMP, UA TSH

## 2022-06-24 NOTE — Telephone Encounter (Signed)
She is doing well, but her BP is 210/90 and has been elevated. Denies headache, chest pain, shortness of breath. Increase losartan to 50mg  daily.

## 2022-06-27 LAB — HEPATIC FUNCTION PANEL
ALT: 7 U/L (ref 7–35)
AST: 15 (ref 13–35)
Alkaline Phosphatase: 52 (ref 25–125)
Bilirubin, Total: 0.8

## 2022-06-27 LAB — BASIC METABOLIC PANEL
BUN: 14 (ref 4–21)
CO2: 27 — AB (ref 13–22)
Chloride: 99 (ref 99–108)
Creatinine: 0.7 (ref 0.5–1.1)
Glucose: 119
Potassium: 3.7 meq/L (ref 3.5–5.1)
Sodium: 135 — AB (ref 137–147)

## 2022-06-27 LAB — COMPREHENSIVE METABOLIC PANEL
Albumin: 4.4 (ref 3.5–5.0)
Calcium: 9.7 (ref 8.7–10.7)
Globulin: 2.2
eGFR: 78

## 2022-06-27 LAB — CBC AND DIFFERENTIAL
HCT: 34 — AB (ref 36–46)
Hemoglobin: 11.6 — AB (ref 12.0–16.0)
Neutrophils Absolute: 1760
Platelets: 167 10*3/uL (ref 150–400)
WBC: 3.6

## 2022-06-27 LAB — PROTEIN / CREATININE RATIO, URINE
Albumin, U: 2.4
Creatinine, Urine: 51

## 2022-06-27 LAB — CBC: RBC: 3.65 — AB (ref 3.87–5.11)

## 2022-06-27 LAB — TSH: TSH: 5.29 (ref 0.41–5.90)

## 2022-06-27 LAB — MICROALBUMIN / CREATININE URINE RATIO: Microalb Creat Ratio: 47

## 2022-07-01 ENCOUNTER — Non-Acute Institutional Stay: Payer: Medicare Other | Admitting: Student

## 2022-07-01 DIAGNOSIS — N3000 Acute cystitis without hematuria: Secondary | ICD-10-CM

## 2022-07-01 DIAGNOSIS — I10 Essential (primary) hypertension: Secondary | ICD-10-CM

## 2022-07-01 DIAGNOSIS — F419 Anxiety disorder, unspecified: Secondary | ICD-10-CM

## 2022-07-01 DIAGNOSIS — F325 Major depressive disorder, single episode, in full remission: Secondary | ICD-10-CM

## 2022-07-01 DIAGNOSIS — R531 Weakness: Secondary | ICD-10-CM

## 2022-07-01 MED ORDER — HYDROCHLOROTHIAZIDE 25 MG PO TABS
12.5000 mg | ORAL_TABLET | Freq: Every day | ORAL | 3 refills | Status: DC
Start: 2022-07-01 — End: 2022-11-01

## 2022-07-01 MED ORDER — SULFAMETHOXAZOLE-TRIMETHOPRIM 800-160 MG PO TABS
1.0000 | ORAL_TABLET | Freq: Two times a day (BID) | ORAL | 0 refills | Status: AC
Start: 2022-07-01 — End: 2022-07-06

## 2022-07-01 MED ORDER — BUSPIRONE HCL 5 MG PO TABS
5.0000 mg | ORAL_TABLET | Freq: Two times a day (BID) | ORAL | 0 refills | Status: DC | PRN
Start: 2022-07-01 — End: 2023-11-21

## 2022-07-01 NOTE — Progress Notes (Signed)
Location:  Other [facility]) Nursing Home Room Number: [address] Place of Service:  ALF [phone]) Provider:  Ander Gaster, Benetta Spar, MD  Patient Care Team: Earnestine Mealing, MD as PCP - General (Family Medicine)  Extended Emergency Contact Information Primary Emergency Contact: [place]horst,Bill Mobile Phone: [phone] Relation: Son  Code Status:  DNR Goals of care: Advanced Directive information    06/24/2022    2:46 PM  Advanced Directives  Does Patient Have a Medical Advance Directive? Yes  Type of Advance Directive Out of facility DNR (pink MOST or yellow form);Healthcare Power of Attorney  Does patient want to make changes to medical advance directive? No - Patient declined  Copy of Healthcare Power of Attorney in Chart? No - copy requested     Chief Complaint  Patient presents with   Extremity Weakness    HPI:  Pt is a 89 y.o. female seen today for an acute visit for weakness, tachycardia.   Patient has had trouble getting to her walker today and getting around her room.   HR initially noted to be 129 now down to 110.   Denies chest pain, headache. She endorses shortness of breath.   Her son is present and helping with history. She has been skipping meals at lunch time. She never has weakness like this. She says it doesn't feel like anxiety.   She was off balance and having difficulty doing things independently.   Discussed with patient and son and preference would be to keep patient out of the hospital if possible. Understand the risk of stroke, etc. Patient has had improvement of symptoms with drinking water and gatorade. She is very anxious about being alone this weekend and agrees to go to memory care for respite at this time.   Past Medical History:  Diagnosis Date   Carotid arterial disease (HCC)    GERD (gastroesophageal reflux disease)    Hyperlipidemia    Hypertension    Hypothyroidism    MDD (major depressive disorder), single episode, in  full remission (HCC)    Polyneuropathy    Past Surgical History:  Procedure Laterality Date   ORIF DISTAL RADIUS FRACTURE     with redo twice   TOTAL HIP ARTHROPLASTY Right ~2011    Allergies  Allergen Reactions   Avelox [Moxifloxacin]    Levaquin [Levofloxacin]     Outpatient Encounter Medications as of 07/01/2022  Medication Sig   busPIRone (BUSPAR) 5 MG tablet Take 1 tablet (5 mg total) by mouth 2 (two) times daily as needed (ANXIETY).   hydrochlorothiazide (HYDRODIURIL) 25 MG tablet Take 0.5 tablets (12.5 mg total) by mouth daily.   sulfamethoxazole-trimethoprim (BACTRIM DS) 800-160 MG tablet Take 1 tablet by mouth 2 (two) times daily for 5 days.   acetaminophen (TYLENOL) 325 MG tablet Take 650 mg by mouth every 4 (four) hours as needed.   aluminum-magnesium hydroxide 200-200 MG/5ML suspension Take 5 mLs by mouth every 4 (four) hours as needed for indigestion.   atorvastatin (LIPITOR) 10 MG tablet Take 1 tablet (10 mg total) by mouth daily.   bismuth subsalicylate (PEPTO BISMOL) 262 MG/15ML suspension Take 30 mLs by mouth as needed.   Calcium Carbonate (CALCIUM 600 PO) Take by mouth daily.   carbamide peroxide (DEBROX) 6.5 % OTIC solution Place 5 drops into both ears as needed.   cetirizine (ZYRTEC) 5 MG tablet Take 5 mg by mouth daily as needed for allergies.   clopidogrel (PLAVIX) 75 MG tablet Take 1 tablet (75 mg total) by  mouth daily.   dextromethorphan-guaiFENesin (TUSSIN DM) 10-100 MG/5ML liquid Take 5 mLs by mouth every 4 (four) hours as needed for cough.   dextrose (GLUTOSE) 40 % GEL Take 1 Tube by mouth as needed for low blood sugar.   gabapentin (NEURONTIN) 400 MG capsule TAKE TWO CAPSULES BY MOUTH AT BEDTIME   levothyroxine (SYNTHROID) 75 MCG tablet Take 1 tablet (75 mcg total) by mouth daily before breakfast.   losartan (COZAAR) 100 MG tablet Take 1 tablet (100 mg total) by mouth daily.   magnesium hydroxide (MILK OF MAGNESIA) 400 MG/5ML suspension Take 5 mLs by mouth  daily as needed for mild constipation.   metoprolol succinate (TOPROL-XL) 25 MG 24 hr tablet Take 1 tablet (25 mg total) by mouth 2 (two) times daily.   mirtazapine (REMERON) 30 MG tablet Take 1 tablet (30 mg total) by mouth at bedtime.   nystatin (MYCOSTATIN/NYSTOP) powder Apply 1 Application topically. To excoriated area as needed twice daily.   ondansetron (ZOFRAN-ODT) 4 MG disintegrating tablet Take 1 tablet (4 mg total) by mouth every 8 (eight) hours as needed for nausea or vomiting.   OXYGEN 2lpm for dyspnea or SOB   vitamin B-12 (CYANOCOBALAMIN) 1000 MCG tablet Take 1,000 mcg by mouth daily.   No facility-administered encounter medications on file as of 07/01/2022.    Review of Systems  Immunization History  Administered Date(s) Administered   Moderna Sars-Covid-2 Vaccination 01/25/2019, 02/24/2019, 12/06/2019, 06/11/2020   PNEUMOCOCCAL CONJUGATE-20 11/03/2021   Respiratory Syncytial Virus Vaccine,Recomb Aduvanted(Arexvy) 11/03/2021   Zoster Recombinat (Shingrix) 07/31/2017, 10/02/2017   Pertinent  Health Maintenance Due  Topic Date Due   DEXA SCAN  Never done   INFLUENZA VACCINE  08/25/2022      03/07/2021    8:40 PM 08/11/2021    9:33 AM 10/29/2021    1:21 PM 03/07/2022    2:24 PM 03/29/2022   10:46 AM  Fall Risk  Falls in the past year?  0 0 1 1  Was there an injury with Fall?   0 0 0  Fall Risk Category Calculator   0 2 1  Fall Risk Category (Retired)   Low    (RETIRED) Patient Fall Risk Level Low fall risk  Low fall risk    Patient at Risk for Falls Due to   No Fall Risks History of fall(s) No Fall Risks  Fall risk Follow up   Falls evaluation completed Falls evaluation completed Falls evaluation completed   Functional Status Survey:    Vitals:   07/01/22 1542 07/01/22 1543  BP: (!) 183/81 (!) 170/68  Pulse: (!) 129 (!) 111  Resp: 18   Temp: 98 F (36.7 C)   SpO2: 93%   Weight: 153 lb 12.8 oz (69.8 kg)    Body mass index is 23.39 kg/m. Physical  Exam Constitutional:      Appearance: Normal appearance.  HENT:     Ears:     Comments: Hard of hearing  Cardiovascular:     Rate and Rhythm: Regular rhythm. Tachycardia present.     Pulses: Normal pulses.     Heart sounds: Normal heart sounds.  Pulmonary:     Effort: Pulmonary effort is normal.     Breath sounds: Normal breath sounds.  Abdominal:     General: Abdomen is flat.     Palpations: Abdomen is soft.     Tenderness: There is no abdominal tenderness.  Musculoskeletal:     Comments: 4/5 grip strength, 5/5 bicep/tricep, 5/5 bilateral lower extremity  strength  Neurological:     Mental Status: She is alert and oriented to person, place, and time.     Cranial Nerves: No cranial nerve deficit.     Comments: Speaks clearly in full sentences  Psychiatric:     Comments: anxious     Labs reviewed: Recent Labs    08/11/21 0953 03/31/22 0851  NA 138 134*  K 3.9 3.8  CL 99 97*  CO2 31 30  GLUCOSE 81 102*  BUN 16 14  CREATININE 0.74 0.81  CALCIUM 9.9 10.0   Recent Labs    08/11/21 0953 03/31/22 0851  AST 17 14  ALT 8 8  ALKPHOS 55  --   BILITOT 0.7 0.8  PROT 7.2 7.4  ALBUMIN 4.8  --    Recent Labs    08/11/21 0953 03/31/22 0851  WBC 4.2 5.0  NEUTROABS  --  2,335  HGB 13.1 12.4  HCT 38.7 37.8  MCV 93.1 94.5  PLT 188.0 166   Lab Results  Component Value Date   TSH 8.52 (H) 03/31/2022   No results found for: "HGBA1C" Lab Results  Component Value Date   CHOL 154 03/31/2022   HDL 54 03/31/2022   LDLCALC 79 03/31/2022   TRIG 109 03/31/2022   CHOLHDL 2.9 03/31/2022  Urine Culture from Facility:   Significant Diagnostic Results in last 30 days:  No results found.  Assessment/Plan Acute cystitis without hematuria - Plan: sulfamethoxazole-trimethoprim (BACTRIM DS) 800-160 MG tablet  Anxiety - Plan: busPIRone (BUSPAR) 5 MG tablet  MDD (major depressive disorder), single episode, in full remission (HCC)  Primary hypertension - Plan:  hydrochlorothiazide (HYDRODIURIL) 25 MG tablet  Weakness  Patient's culture from earlier this week > 100k E. Coli. Susceptible to Bactrim 800-160 BID for 5 days. Encourage hydration. Likely cause of generalized weakness in addition to poor PO intake after transition to AL. Discussed possibility of stroke vs TIA given elevated BP, however, her strength is unchanged from previous exam 1 week ago. Discussed possible outpatient imaging in the upcoming week. Physical therapy to [facility]. Transition [facility] to [facility] for Respite and  for 24/7 monitoring. Buspar 5 mg BID prn for anxiety second line trazodone 25 mg BID prn. Continue mirtazapine 30 mg. Consider transition to SSRI after reevaluation. BP elevated. Recent increase of losartan to 100 mg daily. Add hydrochlorothiazide 12.5 mg daily. CBC, BMP, ProBNP, and ECG ordered for early next week. If no resolution of symptoms, will order a CT head. Per patient and family preference will defer emergency department at this time. If there are any additional changes in speech, strength, etc, patient to present to the emergency department.    Family/ staff Communication: Ernestina Patches, Nursing  Labs/tests ordered:  CBC, BMP, ProBNP, ECG  I spent greater than 60 minutes for the care of this patient in face to face time, chart review, clinical documentation, patient education, care coordination, GOC conversation.

## 2022-07-04 ENCOUNTER — Non-Acute Institutional Stay: Payer: Medicare Other | Admitting: Student

## 2022-07-04 ENCOUNTER — Encounter: Payer: Self-pay | Admitting: Student

## 2022-07-04 ENCOUNTER — Telehealth: Payer: Self-pay | Admitting: Student

## 2022-07-04 DIAGNOSIS — N3000 Acute cystitis without hematuria: Secondary | ICD-10-CM | POA: Diagnosis not present

## 2022-07-04 DIAGNOSIS — I10 Essential (primary) hypertension: Secondary | ICD-10-CM | POA: Diagnosis not present

## 2022-07-04 NOTE — Progress Notes (Signed)
Location:  Other [place].  Nursing Home Room Number: [facility] [address] Place of Service:  ALF [phone]) Provider:  Earnestine Mealing, MD  Patient Care Team: Earnestine Mealing, MD as PCP - General (Family Medicine)  Extended Emergency Contact Information Primary Emergency Contact: Sleeper,Ellison Mobile Phone: [phone] Relation: Son  Code Status:  DNR Goals of care: Advanced Directive information    07/04/2022    9:06 AM  Advanced Directives  Does Patient Have a Medical Advance Directive? Yes  Type of Estate agent of [place];Out of facility DNR (pink MOST or yellow form)  Does patient want to make changes to medical advance directive? No - Patient declined  Copy of Healthcare Power of Attorney in Chart? Yes - validated most recent copy scanned in chart (See row information)     Chief Complaint  Patient presents with   Acute Visit    Follow up Blood Pressure.     HPI:  Pt is a 89 y.o. female seen today for an acute visit for Follow up Blood Pressure.   Patient is doing well. "I got my legs back" yesterday. She was able to walk with her walker on Saturday. She is eating well. BP is back to normal. She doesn't feel fully safe to return home at this time. Would like eval by PT.  Son sent a message (visible under encounters) with updates. He will be out of contact for the upcoming days due to personal health.   Past Medical History:  Diagnosis Date   Carotid arterial disease (HCC)    GERD (gastroesophageal reflux disease)    Hyperlipidemia    Hypertension    Hypothyroidism    MDD (major depressive disorder), single episode, in full remission (HCC)    Polyneuropathy    Past Surgical History:  Procedure Laterality Date   ORIF DISTAL RADIUS FRACTURE     with redo twice   TOTAL HIP ARTHROPLASTY Right ~2011    Allergies  Allergen Reactions   Avelox [Moxifloxacin]    Levaquin [Levofloxacin]     Outpatient Encounter Medications as of 07/04/2022   Medication Sig   acetaminophen (TYLENOL) 500 MG tablet Take 1,000 mg by mouth every 4 (four) hours as needed.   amLODipine (NORVASC) 5 MG tablet Take 5 mg by mouth daily.   atorvastatin (LIPITOR) 10 MG tablet Take 1 tablet (10 mg total) by mouth daily.   busPIRone (BUSPAR) 5 MG tablet Take 1 tablet (5 mg total) by mouth 2 (two) times daily as needed (ANXIETY).   Calcium Carbonate (CALCIUM 600 PO) Take by mouth daily.   Cholecalciferol (VITAMIN D3) 50 MCG (2000 UT) TABS Give one by mouth once daily.   clopidogrel (PLAVIX) 75 MG tablet Take 1 tablet (75 mg total) by mouth daily.   docusate sodium (COLACE) 100 MG capsule Take 100 mg by mouth daily.   gabapentin (NEURONTIN) 400 MG capsule TAKE TWO CAPSULES BY MOUTH AT BEDTIME   hydrochlorothiazide (HYDRODIURIL) 25 MG tablet Take 0.5 tablets (12.5 mg total) by mouth daily.   levothyroxine (SYNTHROID) 88 MCG tablet Take 88 mcg by mouth daily before breakfast.   losartan (COZAAR) 100 MG tablet Take 1 tablet (100 mg total) by mouth daily.   metoprolol succinate (TOPROL-XL) 25 MG 24 hr tablet Take 1 tablet (25 mg total) by mouth 2 (two) times daily.   mirtazapine (REMERON) 30 MG tablet Take 1 tablet (30 mg total) by mouth at bedtime.   OXYGEN 2lpm for dyspnea or SOB   sulfamethoxazole-trimethoprim (BACTRIM DS)  800-160 MG tablet Take 1 tablet by mouth 2 (two) times daily for 5 days.   traZODone (DESYREL) 50 MG tablet Take 25 mg by mouth at bedtime as needed for sleep.   vitamin B-12 (CYANOCOBALAMIN) 1000 MCG tablet Take 1,000 mcg by mouth daily.   [DISCONTINUED] acetaminophen (TYLENOL) 325 MG tablet Take 650 mg by mouth every 4 (four) hours as needed.   [DISCONTINUED] aluminum-magnesium hydroxide 200-200 MG/5ML suspension Take 5 mLs by mouth every 4 (four) hours as needed for indigestion.   [DISCONTINUED] bismuth subsalicylate (PEPTO BISMOL) 262 MG/15ML suspension Take 30 mLs by mouth as needed.   [DISCONTINUED] carbamide peroxide (DEBROX) 6.5 % OTIC  solution Place 5 drops into both ears as needed.   [DISCONTINUED] cetirizine (ZYRTEC) 5 MG tablet Take 5 mg by mouth daily as needed for allergies.   [DISCONTINUED] dextromethorphan-guaiFENesin (TUSSIN DM) 10-100 MG/5ML liquid Take 5 mLs by mouth every 4 (four) hours as needed for cough.   [DISCONTINUED] dextrose (GLUTOSE) 40 % GEL Take 1 Tube by mouth as needed for low blood sugar.   [DISCONTINUED] levothyroxine (SYNTHROID) 75 MCG tablet Take 1 tablet (75 mcg total) by mouth daily before breakfast.   [DISCONTINUED] magnesium hydroxide (MILK OF MAGNESIA) 400 MG/5ML suspension Take 5 mLs by mouth daily as needed for mild constipation.   [DISCONTINUED] nystatin (MYCOSTATIN/NYSTOP) powder Apply 1 Application topically. To excoriated area as needed twice daily.   [DISCONTINUED] ondansetron (ZOFRAN-ODT) 4 MG disintegrating tablet Take 1 tablet (4 mg total) by mouth every 8 (eight) hours as needed for nausea or vomiting.   No facility-administered encounter medications on file as of 07/04/2022.    Review of Systems  Immunization History  Administered Date(s) Administered   Moderna Sars-Covid-2 Vaccination 01/25/2019, 02/24/2019, 12/06/2019, 06/11/2020   PNEUMOCOCCAL CONJUGATE-20 11/03/2021   Respiratory Syncytial Virus Vaccine,Recomb Aduvanted(Arexvy) 11/03/2021   Zoster Recombinat (Shingrix) 07/31/2017, 10/02/2017   Pertinent  Health Maintenance Due  Topic Date Due   DEXA SCAN  Never done   INFLUENZA VACCINE  08/25/2022      03/07/2021    8:40 PM 08/11/2021    9:33 AM 10/29/2021    1:21 PM 03/07/2022    2:24 PM 03/29/2022   10:46 AM  Fall Risk  Falls in the past year?  0 0 1 1  Was there an injury with Fall?   0 0 0  Fall Risk Category Calculator   0 2 1  Fall Risk Category (Retired)   Low    (RETIRED) Patient Fall Risk Level Low fall risk  Low fall risk    Patient at Risk for Falls Due to   No Fall Risks History of fall(s) No Fall Risks  Fall risk Follow up   Falls evaluation completed  Falls evaluation completed Falls evaluation completed   Functional Status Survey:    Vitals:   07/04/22 0855  BP: 136/74  Pulse: 100  Resp: 20  Temp: 98.9 F (37.2 C)  SpO2: 97%  Weight: 153 lb (69.4 kg)  Height: 5\' 8"  (1.727 m)   Body mass index is 23.26 kg/m. Physical Exam Constitutional:      Appearance: Normal appearance.  Cardiovascular:     Rate and Rhythm: Normal rate and regular rhythm.     Pulses: Normal pulses.  Pulmonary:     Effort: Pulmonary effort is normal.  Abdominal:     General: Abdomen is flat.  Neurological:     Mental Status: She is alert and oriented to person, place, and time.  Labs reviewed: Recent Labs    08/11/21 0953 03/31/22 0851  NA 138 134*  K 3.9 3.8  CL 99 97*  CO2 31 30  GLUCOSE 81 102*  BUN 16 14  CREATININE 0.74 0.81  CALCIUM 9.9 10.0   Recent Labs    08/11/21 0953 03/31/22 0851  AST 17 14  ALT 8 8  ALKPHOS 55  --   BILITOT 0.7 0.8  PROT 7.2 7.4  ALBUMIN 4.8  --    Recent Labs    08/11/21 0953 03/31/22 0851  WBC 4.2 5.0  NEUTROABS  --  2,335  HGB 13.1 12.4  HCT 38.7 37.8  MCV 93.1 94.5  PLT 188.0 166   Lab Results  Component Value Date   TSH 8.52 (H) 03/31/2022   No results found for: "HGBA1C" Lab Results  Component Value Date   CHOL 154 03/31/2022   HDL 54 03/31/2022   LDLCALC 79 03/31/2022   TRIG 109 03/31/2022   CHOLHDL 2.9 03/31/2022    Significant Diagnostic Results in last 30 days:  No results found.  Assessment/Plan Primary hypertension  Acute cystitis without hematuria Patient admitted to memory care for respite care and support due to weakness in the setting of UTI and poor PO intake. Plan for PT to evaluate and help determine plan for return to apartment. BP improved with addition of HCTZ. Continue regular checks to determine need of second medication.    Family/ staff Communication: Son, nursing  Labs/tests ordered:  none   I spent greater than 30 minutes for the care of  this patient in face to face time, chart review, clinical documentation, patient education.

## 2022-07-04 NOTE — Telephone Encounter (Signed)
Called patient's son and unable to reach. Will send epic message.

## 2022-08-15 LAB — TSH: TSH: 3.31 (ref 0.41–5.90)

## 2022-08-16 ENCOUNTER — Encounter: Payer: Medicare Other | Admitting: Internal Medicine

## 2022-08-17 ENCOUNTER — Encounter: Payer: Medicare Other | Admitting: Student

## 2022-08-18 ENCOUNTER — Encounter: Payer: Medicare Other | Admitting: Nurse Practitioner

## 2022-08-29 ENCOUNTER — Encounter: Payer: Self-pay | Admitting: Student

## 2022-09-06 ENCOUNTER — Encounter: Payer: Self-pay | Admitting: Student

## 2022-10-04 ENCOUNTER — Encounter: Payer: Self-pay | Admitting: Nurse Practitioner

## 2022-10-04 ENCOUNTER — Non-Acute Institutional Stay: Payer: Self-pay | Admitting: Nurse Practitioner

## 2022-10-04 DIAGNOSIS — F325 Major depressive disorder, single episode, in full remission: Secondary | ICD-10-CM | POA: Diagnosis not present

## 2022-10-04 DIAGNOSIS — K219 Gastro-esophageal reflux disease without esophagitis: Secondary | ICD-10-CM | POA: Diagnosis not present

## 2022-10-04 DIAGNOSIS — G629 Polyneuropathy, unspecified: Secondary | ICD-10-CM

## 2022-10-04 DIAGNOSIS — F419 Anxiety disorder, unspecified: Secondary | ICD-10-CM | POA: Diagnosis not present

## 2022-10-04 DIAGNOSIS — I10 Essential (primary) hypertension: Secondary | ICD-10-CM

## 2022-10-04 DIAGNOSIS — E039 Hypothyroidism, unspecified: Secondary | ICD-10-CM

## 2022-10-04 DIAGNOSIS — K5904 Chronic idiopathic constipation: Secondary | ICD-10-CM

## 2022-10-04 NOTE — Progress Notes (Unsigned)
Location:  Other [place].  Nursing Home Room Number: [facility] [address] Place of Service:  ALF [phone]) Nicole Chatters, NP  PCP: Earnestine Mealing, MD  Patient Care Team: Earnestine Mealing, MD as PCP - General (Family Medicine)  Extended Emergency Contact Information Primary Emergency Contact: Nicole Ellison Mobile Phone: [phone] Relation: Son  Goals of care: Advanced Directive information    10/04/2022    3:11 PM  Advanced Directives  Does Patient Have a Medical Advance Directive? Yes  Type of Estate agent of [place];Out of facility DNR (pink MOST or yellow form)  Does patient want to make changes to medical advance directive? No - Patient declined  Copy of Healthcare Power of Attorney in Chart? Yes - validated most recent copy scanned in chart (See row information)     Chief Complaint  Patient presents with   Medical Management of Chronic Issues    Medical Management of Chronic Issues.     HPI:  Pt is a 89 y.o. female seen today for medical management of chronic disease.  Pt doing well at this time.  Occasionally has trouble getting to sleep at night.  Taking gabapentin for neuropathy which is controlled  Reports she exercises  She worked with a PT for a while but she has signed off now.   Noticing some anxiety with the day to day  GERD- well controlled  Constipation- occasionally will have issues and take PRN. She has miralax that she is taking daily   Reports anxiety and depression doing well. She had more anxiety when living alone but occasionally still has anxiety.  She has buspar ordered but only taking PRN.    Past Medical History:  Diagnosis Date   Carotid arterial disease (HCC)    GERD (gastroesophageal reflux disease)    Hyperlipidemia    Hypertension    Hypothyroidism    MDD (major depressive disorder), single episode, in full remission (HCC)    Polyneuropathy    Past Surgical History:  Procedure Laterality Date   ORIF  DISTAL RADIUS FRACTURE     with redo twice   TOTAL HIP ARTHROPLASTY Right ~2011    Allergies  Allergen Reactions   Avelox [Moxifloxacin]    Levaquin [Levofloxacin]     Outpatient Encounter Medications as of 10/04/2022  Medication Sig   acetaminophen (TYLENOL) 325 MG tablet Take 650 mg by mouth every 4 (four) hours as needed.   acetaminophen (TYLENOL) 650 MG CR tablet Take 650 mg by mouth at bedtime.   aluminum-magnesium hydroxide 200-200 MG/5ML suspension Take 5 mLs by mouth every 4 (four) hours as needed for indigestion.   amLODipine (NORVASC) 5 MG tablet Take 5 mg by mouth daily.   atorvastatin (LIPITOR) 10 MG tablet Take 1 tablet (10 mg total) by mouth daily.   bismuth subsalicylate (PEPTO BISMOL) 262 MG/15ML suspension Take 30 mLs by mouth as needed.   busPIRone (BUSPAR) 5 MG tablet Take 1 tablet (5 mg total) by mouth 2 (two) times daily as needed (ANXIETY).   Calcium Carbonate (CALCIUM 600 PO) Take by mouth daily.   cetirizine (ZYRTEC) 5 MG tablet Take 5 mg by mouth daily.   Cholecalciferol (VITAMIN D3) 50 MCG (2000 UT) TABS Give one by mouth once daily.   clopidogrel (PLAVIX) 75 MG tablet Take 1 tablet (75 mg total) by mouth daily.   dextromethorphan-guaiFENesin (ROBITUSSIN-DM) 10-100 MG/5ML liquid Take 5 mLs by mouth every 4 (four) hours as needed for cough.   docusate sodium (COLACE) 100 MG capsule Take 100  mg by mouth daily.   gabapentin (NEURONTIN) 400 MG capsule TAKE TWO CAPSULES BY MOUTH AT BEDTIME   hydrochlorothiazide (HYDRODIURIL) 25 MG tablet Take 0.5 tablets (12.5 mg total) by mouth daily.   levothyroxine (SYNTHROID) 88 MCG tablet Take 88 mcg by mouth daily before breakfast.   losartan (COZAAR) 100 MG tablet Take 1 tablet (100 mg total) by mouth daily.   magnesium hydroxide (MILK OF MAGNESIA) 400 MG/5ML suspension Take 5 mLs by mouth daily as needed for mild constipation.   metoprolol succinate (TOPROL-XL) 25 MG 24 hr tablet Take 1 tablet (25 mg total) by mouth 2 (two)  times daily.   mirtazapine (REMERON) 30 MG tablet Take 1 tablet (30 mg total) by mouth at bedtime.   nystatin (MYCOSTATIN/NYSTOP) powder Apply 1 Application topically 2 (two) times daily as needed.   ondansetron (ZOFRAN) 4 MG tablet Take 4 mg by mouth every 8 (eight) hours as needed for nausea or vomiting.   OXYGEN 2lpm for dyspnea or SOB   traZODone (DESYREL) 50 MG tablet Take 25 mg by mouth 2 (two) times daily as needed for sleep.   vitamin B-12 (CYANOCOBALAMIN) 1000 MCG tablet Take 1,000 mcg by mouth daily.   [DISCONTINUED] acetaminophen (TYLENOL) 500 MG tablet Take 1,000 mg by mouth every 4 (four) hours as needed.   No facility-administered encounter medications on file as of 10/04/2022.    Review of Systems  Constitutional:  Negative for activity change, appetite change, fatigue and unexpected weight change.  HENT:  Negative for congestion and hearing loss.   Eyes: Negative.   Respiratory:  Negative for cough and shortness of breath.   Cardiovascular:  Negative for chest pain, palpitations and leg swelling.  Gastrointestinal:  Negative for abdominal pain, constipation and diarrhea.  Genitourinary:  Negative for difficulty urinating and dysuria.  Musculoskeletal:  Negative for arthralgias and myalgias.  Skin:  Negative for color change and wound.  Neurological:  Negative for dizziness and weakness.  Psychiatric/Behavioral:  Negative for agitation, behavioral problems and confusion. The patient is nervous/anxious.      Immunization History  Administered Date(s) Administered   Moderna Sars-Covid-2 Vaccination 01/25/2019, 02/24/2019, 12/06/2019, 06/11/2020   PNEUMOCOCCAL CONJUGATE-20 11/03/2021   Respiratory Syncytial Virus Vaccine,Recomb Aduvanted(Arexvy) 11/03/2021   Zoster Recombinant(Shingrix) 07/31/2017, 10/02/2017   Pertinent  Health Maintenance Due  Topic Date Due   DEXA SCAN  Never done   INFLUENZA VACCINE  Never done      03/07/2021    8:40 PM 08/11/2021    9:33 AM  10/29/2021    1:21 PM 03/07/2022    2:24 PM 03/29/2022   10:46 AM  Fall Risk  Falls in the past year?  0 0 1 1  Was there an injury with Fall?   0 0 0  Fall Risk Category Calculator   0 2 1  Fall Risk Category (Retired)   Low    (RETIRED) Patient Fall Risk Level Low fall risk  Low fall risk    Patient at Risk for Falls Due to   No Fall Risks History of fall(s) No Fall Risks  Fall risk Follow up   Falls evaluation completed Falls evaluation completed Falls evaluation completed   Functional Status Survey:    Vitals:   10/04/22 1459  BP: 118/76  Pulse: 72  Resp: 14  Temp: 97.8 F (36.6 C)  SpO2: 99%  Weight: 152 lb (68.9 kg)  Height: 5\' 8"  (1.727 m)   Body mass index is 23.11 kg/m. Physical Exam Constitutional:  General: She is not in acute distress.    Appearance: She is well-developed. She is not diaphoretic.  HENT:     Head: Normocephalic and atraumatic.     Mouth/Throat:     Pharynx: No oropharyngeal exudate.  Eyes:     Conjunctiva/sclera: Conjunctivae normal.     Pupils: Pupils are equal, round, and reactive to light.  Cardiovascular:     Rate and Rhythm: Normal rate and regular rhythm.     Heart sounds: Normal heart sounds.  Pulmonary:     Effort: Pulmonary effort is normal.     Breath sounds: Normal breath sounds.  Abdominal:     General: Bowel sounds are normal.     Palpations: Abdomen is soft.  Musculoskeletal:     Cervical back: Normal range of motion and neck supple.     Right lower leg: No edema.     Left lower leg: No edema.  Skin:    General: Skin is warm and dry.  Neurological:     Mental Status: She is alert.  Psychiatric:        Mood and Affect: Mood normal.     Labs reviewed: Recent Labs    03/31/22 0851 06/27/22 0000  NA 134* 135*  K 3.8 3.7  CL 97* 99  CO2 30 27*  GLUCOSE 102*  --   BUN 14 14  CREATININE 0.81 0.7  CALCIUM 10.0 9.7   Recent Labs    03/31/22 0851 06/27/22 0000  AST 14 15  ALT 8 7  ALKPHOS  --  52   BILITOT 0.8  --   PROT 7.4  --   ALBUMIN  --  4.4   Recent Labs    03/31/22 0851 06/27/22 0000  WBC 5.0 3.6  NEUTROABS 2,335 1,760.00  HGB 12.4 11.6*  HCT 37.8 34*  MCV 94.5  --   PLT 166 167   Lab Results  Component Value Date   TSH 3.31 08/15/2022   No results found for: "HGBA1C" Lab Results  Component Value Date   CHOL 154 03/31/2022   HDL 54 03/31/2022   LDLCALC 79 03/31/2022   TRIG 109 03/31/2022   CHOLHDL 2.9 03/31/2022    Significant Diagnostic Results in last 30 days:  No results found.  Assessment/Plan 1. Primary hypertension -Blood pressure well controlled, goal bp <140/90 Continue current medications and dietary modifications follow metabolic panel   2. Anxiety She has buspar but taking PRN, will schedule so she can get therapeutic effect of medication.   3. MDD (major depressive disorder), single episode, in full remission (HCC) No signs of worsening depression, continues on remeron at bedtime Will dc trazodone since not using and was PRN  4. Gastroesophageal reflux disease without esophagitis Controlled   5. Hypothyroidism, unspecified type TSH at goal in July   6. Polyneuropathy Stable, continue on gabapentin   7. Chronic idiopathic constipation Currently taking miralax 17 gm   Nicole Ellison. Biagio Borg [facility] [phone]

## 2022-10-04 NOTE — Progress Notes (Unsigned)
Error

## 2022-10-06 ENCOUNTER — Encounter: Payer: Self-pay | Admitting: Student

## 2022-10-19 ENCOUNTER — Ambulatory Visit: Payer: Medicare Other | Admitting: Student

## 2022-11-01 ENCOUNTER — Encounter: Payer: Self-pay | Admitting: Nurse Practitioner

## 2022-11-01 ENCOUNTER — Non-Acute Institutional Stay (INDEPENDENT_AMBULATORY_CARE_PROVIDER_SITE_OTHER): Payer: Self-pay | Admitting: Nurse Practitioner

## 2022-11-01 DIAGNOSIS — Z0000 Encounter for general adult medical examination without abnormal findings: Secondary | ICD-10-CM | POA: Diagnosis not present

## 2022-11-01 NOTE — Progress Notes (Signed)
Subjective:   Nicole Ellison is a 89 y.o. female who presents for Medicare Annual (Subsequent) preventive examination.  Visit Complete: In person in [place]    Cardiac Risk Factors include: advanced age (>14men, >41 women);dyslipidemia;hypertension;sedentary lifestyle     Objective:    Today's Vitals   11/01/22 1414  BP: 118/76  Pulse: 72  Temp: 97.8 F (36.6 C)  SpO2: 99%  Weight: 152 lb (68.9 kg)  Height: 5\' 8"  (1.727 m)   Body mass index is 23.11 kg/m.     11/01/2022    2:16 PM 10/04/2022    3:11 PM 07/04/2022    9:06 AM 06/24/2022    2:46 PM 03/29/2022   10:48 AM 10/29/2021    1:22 PM 03/07/2021    8:38 PM  Advanced Directives  Does Patient Have a Medical Advance Directive? Yes Yes Yes Yes Yes Yes Yes  Type of Estate agent of [place];Out of facility DNR (pink MOST or yellow form) Healthcare Power of [place];Out of facility DNR (pink MOST or yellow form) Healthcare Power of [place];Out of facility DNR (pink MOST or yellow form) Out of facility DNR (pink MOST or yellow form);Healthcare Power of [employer] of facility DNR (pink MOST or yellow form);Healthcare Power of [employer] of facility DNR (pink MOST or yellow form) Out of facility DNR (pink MOST or yellow form);Living will;Healthcare Power of Attorney  Does patient want to make changes to medical advance directive? No - Patient declined No - Patient declined No - Patient declined No - Patient declined No - Patient declined No - Patient declined No - Patient declined  Copy of Healthcare Power of Attorney in Chart? Yes - validated most recent copy scanned in chart (See row information) Yes - validated most recent copy scanned in chart (See row information) Yes - validated most recent copy scanned in chart (See row information) No - copy requested No - copy requested  No - copy requested  Would patient like information on creating a medical advance directive?       No - Patient declined   Pre-existing out of facility DNR order (yellow form or pink MOST form) Yellow form placed in chart (order not valid for inpatient use)    Yellow form placed in chart (order not valid for inpatient use) Yellow form placed in chart (order not valid for inpatient use)     Current Medications (verified) Outpatient Encounter Medications as of 11/01/2022  Medication Sig   acetaminophen (TYLENOL) 325 MG tablet Take 650 mg by mouth every 4 (four) hours as needed.   acetaminophen (TYLENOL) 650 MG CR tablet Take 650 mg by mouth at bedtime.   aluminum-magnesium hydroxide 200-200 MG/5ML suspension Take 5 mLs by mouth every 4 (four) hours as needed for indigestion.   amLODipine (NORVASC) 5 MG tablet Take 5 mg by mouth daily.   atorvastatin (LIPITOR) 10 MG tablet Take 1 tablet (10 mg total) by mouth daily.   bismuth subsalicylate (PEPTO BISMOL) 262 MG/15ML suspension Take 30 mLs by mouth as needed.   busPIRone (BUSPAR) 5 MG tablet Take 1 tablet (5 mg total) by mouth 2 (two) times daily as needed (ANXIETY).   Calcium Carbonate (CALCIUM 600 PO) Take by mouth daily.   carbamide peroxide (DEBROX) 6.5 % OTIC solution Place 5 drops into both ears as needed.   cetirizine (ZYRTEC) 5 MG tablet Take 5 mg by mouth daily.   Cholecalciferol (VITAMIN D3) 50 MCG (2000 UT) TABS Give one by mouth once daily.  clopidogrel (PLAVIX) 75 MG tablet Take 1 tablet (75 mg total) by mouth daily.   dextromethorphan-guaiFENesin (ROBITUSSIN-DM) 10-100 MG/5ML liquid Take 5 mLs by mouth every 4 (four) hours as needed for cough.   docusate sodium (COLACE) 100 MG capsule Take 100 mg by mouth daily.   gabapentin (NEURONTIN) 400 MG capsule TAKE TWO CAPSULES BY MOUTH AT BEDTIME   Glucose 15 GM/32ML GEL Take 1 packet by mouth as needed (for low blood sugar).   hydrochlorothiazide (MICROZIDE) 12.5 MG capsule Take 12.5 mg by mouth daily.   levothyroxine (SYNTHROID) 88 MCG tablet Take 88 mcg by mouth daily before breakfast.   losartan (COZAAR)  100 MG tablet Take 1 tablet (100 mg total) by mouth daily.   magnesium hydroxide (MILK OF MAGNESIA) 400 MG/5ML suspension Take 5 mLs by mouth daily as needed for mild constipation.   metoprolol succinate (TOPROL-XL) 25 MG 24 hr tablet Take 1 tablet (25 mg total) by mouth 2 (two) times daily.   mirtazapine (REMERON) 30 MG tablet Take 1 tablet (30 mg total) by mouth at bedtime.   nystatin (MYCOSTATIN/NYSTOP) powder Apply 1 Application topically 2 (two) times daily as needed.   ondansetron (ZOFRAN) 4 MG tablet Take 4 mg by mouth every 8 (eight) hours as needed for nausea or vomiting.   OXYGEN 2lpm for dyspnea or SOB   polyethylene glycol (MIRALAX / GLYCOLAX) 17 g packet Take 17 g by mouth as needed.   vitamin B-12 (CYANOCOBALAMIN) 1000 MCG tablet Take 1,000 mcg by mouth daily.   [DISCONTINUED] hydrochlorothiazide (HYDRODIURIL) 25 MG tablet Take 0.5 tablets (12.5 mg total) by mouth daily.   [DISCONTINUED] traZODone (DESYREL) 50 MG tablet Take 25 mg by mouth 2 (two) times daily as needed for sleep.   No facility-administered encounter medications on file as of 11/01/2022.    Allergies (verified) Avelox [moxifloxacin] and Levaquin [levofloxacin]   History: Past Medical History:  Diagnosis Date   Carotid arterial disease (HCC)    GERD (gastroesophageal reflux disease)    Hyperlipidemia    Hypertension    Hypothyroidism    MDD (major depressive disorder), single episode, in full remission (HCC)    Polyneuropathy    Past Surgical History:  Procedure Laterality Date   ORIF DISTAL RADIUS FRACTURE     with redo twice   TOTAL HIP ARTHROPLASTY Right ~2011   Family History  Problem Relation Age of Onset   Alzheimer's disease Mother    Heart disease Father    Liver cancer Sister    Alzheimer's disease Brother    Social History   Socioeconomic History   Marital status: Widowed    Spouse name: Not on file   Number of children: 2   Years of education: Not on file   Highest education  level: Not on file  Occupational History   Occupation: Bank Teller    Comment: Retired 1995  Tobacco Use   Smoking status: Never   Smokeless tobacco: Never  Vaping Use   Vaping status: Never Used  Substance and Sexual Activity   Alcohol use: Not Currently   Drug use: Never   Sexual activity: Not on file  Other Topics Concern   Not on file  Social History Narrative   Has living will   Son Annette Stable is health care POA   Has DNR --confirmed   No tube feeds   Social Determinants of Health   Financial Resource Strain: Not on file  Food Insecurity: Not on file  Transportation Needs: Not on file  Physical Activity: Not on file  Stress: Not on file  Social Connections: Not on file    Tobacco Counseling Counseling given: Not Answered   Clinical Intake:  Pre-visit preparation completed: Yes  Pain : No/denies pain     BMI - recorded: 23 Nutritional Status: BMI of 19-24  Normal Nutritional Risks: None Diabetes: No  How often do you need to have someone help you when you read instructions, pamphlets, or other written materials from your doctor or pharmacy?: 4 - Often (son helps)         Activities of Daily Living    11/01/2022    2:16 PM  In your present state of health, do you have any difficulty performing the following activities:  Hearing? 1  Vision? 0  Difficulty concentrating or making decisions? 1  Walking or climbing stairs? 1  Comment does not climb stairs  Dressing or bathing? 0  Doing errands, shopping? 1  Comment does not drive  Preparing Food and eating ? N  Using the Toilet? N  In the past six months, have you accidently leaked urine? Y  Do you have problems with loss of bowel control? N  Managing your Medications? Y  Comment has help with AL nurse  Managing your Finances? Y  Comment son does her Runner, broadcasting/film/video or managing your Housekeeping? N    Patient Care Team: Earnestine Mealing, MD as PCP - General (Family Medicine)  Indicate  any recent Medical Services you may have received from other than [facility] providers in the past year (date may be approximate).     Assessment:   This is a routine wellness examination for Derisha.  Hearing/Vision screen No results found.   Goals Addressed   None    Depression Screen    03/29/2022   10:46 AM 08/11/2021    9:33 AM 02/22/2021   10:53 AM 08/04/2020   10:34 AM 05/26/2020    7:47 AM 08/07/2019   11:51 AM  PHQ 2/9 Scores  PHQ - 2 Score 0 1 0  0 0  PHQ- 9 Score   0     Exception Documentation    Medical reason      Fall Risk    11/01/2022    2:13 PM 03/29/2022   10:46 AM 03/07/2022    2:24 PM 10/29/2021    1:21 PM 08/11/2021    9:33 AM  Fall Risk   Falls in the past year? 0 1 1 0 0  Number falls in past yr: 0 0 1 0   Injury with Fall?  0 0 0   Risk for fall due to : Impaired balance/gait No Fall Risks History of fall(s) No Fall Risks   Follow up Education provided Falls evaluation completed Falls evaluation completed Falls evaluation completed     MEDICARE RISK AT HOME:    TIMED UP AND GO:  Was the test performed?  No    Cognitive Function:        Immunizations Immunization History  Administered Date(s) Administered   Moderna Sars-Covid-2 Vaccination 01/25/2019, 02/24/2019, 12/06/2019, 06/11/2020   PNEUMOCOCCAL CONJUGATE-20 11/03/2021   Respiratory Syncytial Virus Vaccine,Recomb Aduvanted(Arexvy) 11/03/2021   Zoster Recombinant(Shingrix) 07/31/2017, 10/02/2017    TDAP status: Due, Education has been provided regarding the importance of this vaccine. Advised may receive this vaccine at local pharmacy or Health Dept. Aware to provide a copy of the vaccination record if obtained from local pharmacy or Health Dept. Verbalized acceptance and understanding.  Flu Vaccine status: Due,  Education has been provided regarding the importance of this vaccine. Advised may receive this vaccine at local pharmacy or Health Dept. Aware to provide a copy of the vaccination record  if obtained from local pharmacy or Health Dept. Verbalized acceptance and understanding.  Pneumococcal vaccine status: Up to date  Covid-19 vaccine status: Information provided on how to obtain vaccines.   Qualifies for Shingles Vaccine? Yes   Zostavax completed No   Shingrix Completed?: Yes  Screening Tests Health Maintenance  Topic Date Due   DTaP/Tdap/Td (1 - Tdap) Never done   INFLUENZA VACCINE  Never done   COVID-19 Vaccine (5 - 2023-24 season) 09/25/2022   Medicare Annual Wellness (AWV)  11/01/2023   Pneumonia Vaccine 77+ Years old  Completed   Zoster Vaccines- Shingrix  Completed   HPV VACCINES  Aged Out   DEXA SCAN  Discontinued    Health Maintenance  Health Maintenance Due  Topic Date Due   DTaP/Tdap/Td (1 - Tdap) Never done   INFLUENZA VACCINE  Never done   COVID-19 Vaccine (5 - 2023-24 season) 09/25/2022    Colorectal cancer screening: No longer required.   Mammogram status: No longer required due to age.   Lung Cancer Screening: (Low Dose CT Chest recommended if Age 37-80 years, 20 pack-year currently smoking OR have quit w/in 15years.) does not qualify.   Lung Cancer Screening Referral: na  Additional Screening:  Hepatitis C Screening: does not qualify  Vision Screening: Recommended annual ophthalmology exams for early detection of glaucoma and other disorders of the eye. Is the patient up to date with their annual eye exam?  Yes  Who is the provider or what is the name of the office in which the patient attends annual eye exams? Dr Sharman Crate  If pt is not established with a provider, would they like to be referred to a provider to establish care? No .   Dental Screening: Recommended annual dental exams for proper oral hygiene   Community Resource Referral / Chronic Care Management: CRR required this visit?  No   CCM required this visit?  No     Plan:     I have personally reviewed and noted the following in the patient's chart:    Medical and social history Use of alcohol, tobacco or illicit drugs  Current medications and supplements including opioid prescriptions. Patient is not currently taking opioid prescriptions. Functional ability and status Nutritional status Physical activity Advanced directives List of other physicians Hospitalizations, surgeries, and ER visits in previous 12 months Vitals Screenings to include cognitive, depression, and falls Referrals and appointments  In addition, I have reviewed and discussed with patient certain preventive protocols, quality metrics, and best practice recommendations. A written personalized care plan for preventive services as well as general preventive health recommendations were provided to patient.     Sharon Seller, NP   11/01/2022

## 2022-11-01 NOTE — Patient Instructions (Signed)
  Ms. Tripoli , Thank you for taking time to come for your Medicare Wellness Visit. I appreciate your ongoing commitment to your health goals. Please review the following plan we discussed and let me know if I can assist you in the future.     This is a list of the screening recommended for you and due dates:  Health Maintenance  Topic Date Due   DTaP/Tdap/Td vaccine (1 - Tdap) Never done   DEXA scan (bone density measurement)  Never done   Flu Shot  Never done   COVID-19 Vaccine (5 - 2023-24 season) 09/25/2022   Medicare Annual Wellness Visit  11/01/2023   Pneumonia Vaccine  Completed   Zoster (Shingles) Vaccine  Completed   HPV Vaccine  Aged Out   We do not have records of tdap, will give at facility

## 2022-12-25 ENCOUNTER — Encounter: Payer: Self-pay | Admitting: Student

## 2022-12-26 ENCOUNTER — Non-Acute Institutional Stay: Payer: Medicare Other | Admitting: Student

## 2022-12-26 ENCOUNTER — Encounter: Payer: Self-pay | Admitting: Student

## 2022-12-26 DIAGNOSIS — R197 Diarrhea, unspecified: Secondary | ICD-10-CM | POA: Diagnosis not present

## 2022-12-26 DIAGNOSIS — F325 Major depressive disorder, single episode, in full remission: Secondary | ICD-10-CM

## 2022-12-26 DIAGNOSIS — M545 Low back pain, unspecified: Secondary | ICD-10-CM

## 2022-12-26 DIAGNOSIS — E039 Hypothyroidism, unspecified: Secondary | ICD-10-CM

## 2022-12-26 DIAGNOSIS — I10 Essential (primary) hypertension: Secondary | ICD-10-CM | POA: Diagnosis not present

## 2022-12-26 NOTE — Progress Notes (Signed)
Location:  Other Nursing Home Room Number: 64 S Place of Service:  ALF (13) Provider:  Judeth Horn, MD  Patient Care Team: Earnestine Mealing, MD as PCP - General (Family Medicine)  Extended Emergency Contact Information Primary Emergency Contact: Berges,Bill Mobile Phone: [phone] Relation: Son  Code Status:  DNR Goals of care: Advanced Directive information    11/01/2022    2:16 PM  Advanced Directives  Does Patient Have a Medical Advance Directive? Yes  Type of Estate agent of [place];Out of facility DNR (pink MOST or yellow form)  Does patient want to make changes to medical advance directive? No - Patient declined  Copy of Healthcare Power of Attorney in Chart? Yes - validated most recent copy scanned in chart (See row information)  Pre-existing out of facility DNR order (yellow form or pink MOST form) Yellow form placed in chart (order not valid for inpatient use)     Chief Complaint  Patient presents with   Medical Management of Chronic Conditions   Diarrhea    HPI:  Pt is a 89 y.o. female seen today for an acute visit for feeling ill. She had diarrhe 2 days. Held her miralax Has had some low back pain since Saturday as well. She denies dysuria. She felt chills the other day.   The day she got sick, her legs felt weaker where she couldn't get up and down very well. She had to exercise them. She wasn't able to get up very well like she had been.   She does some seated exercises that she learned from physical therapy.  She hasn't been walking as far as she used to. She hasn't felt like it. She sleeps well. Her weight has been stable.   Denies chest pain or shortness of breath. She is eating well.   She has been more forgetful. She thought it was morning last night that she got up at 9PM and thought it was day time.  She is doing okay from a mood standpoint.     Past Medical History:  Diagnosis Date   Carotid arterial  disease (HCC)    GERD (gastroesophageal reflux disease)    Hyperlipidemia    Hypertension    Hypothyroidism    MDD (major depressive disorder), single episode, in full remission (HCC)    Polyneuropathy    Past Surgical History:  Procedure Laterality Date   ORIF DISTAL RADIUS FRACTURE     with redo twice   TOTAL HIP ARTHROPLASTY Right ~2011    Allergies  Allergen Reactions   Avelox [Moxifloxacin]    Levaquin [Levofloxacin]     Outpatient Encounter Medications as of 12/26/2022  Medication Sig   acetaminophen (TYLENOL) 325 MG tablet Take 650 mg by mouth every 4 (four) hours as needed.   acetaminophen (TYLENOL) 650 MG CR tablet Take 650 mg by mouth at bedtime.   aluminum-magnesium hydroxide 200-200 MG/5ML suspension Take 5 mLs by mouth every 4 (four) hours as needed for indigestion.   amLODipine (NORVASC) 5 MG tablet Take 5 mg by mouth daily.   atorvastatin (LIPITOR) 10 MG tablet Take 1 tablet (10 mg total) by mouth daily.   bismuth subsalicylate (PEPTO BISMOL) 262 MG/15ML suspension Take 30 mLs by mouth as needed.   busPIRone (BUSPAR) 5 MG tablet Take 1 tablet (5 mg total) by mouth 2 (two) times daily as needed (ANXIETY).   Calcium Carbonate (CALCIUM 600 PO) Take by mouth daily.   carbamide peroxide (DEBROX) 6.5 % OTIC solution Place  5 drops into both ears as needed.   cetirizine (ZYRTEC) 5 MG tablet Take 5 mg by mouth daily.   Cholecalciferol (VITAMIN D3) 50 MCG (2000 UT) TABS Give one by mouth once daily.   clopidogrel (PLAVIX) 75 MG tablet Take 1 tablet (75 mg total) by mouth daily.   dextromethorphan-guaiFENesin (ROBITUSSIN-DM) 10-100 MG/5ML liquid Take 5 mLs by mouth every 4 (four) hours as needed for cough.   docusate sodium (COLACE) 100 MG capsule Take 100 mg by mouth daily.   gabapentin (NEURONTIN) 400 MG capsule TAKE TWO CAPSULES BY MOUTH AT BEDTIME   Glucose 15 GM/32ML GEL Take 1 packet by mouth as needed (for low blood sugar).   hydrochlorothiazide (MICROZIDE) 12.5 MG  capsule Take 12.5 mg by mouth daily.   levothyroxine (SYNTHROID) 88 MCG tablet Take 88 mcg by mouth daily before breakfast.   losartan (COZAAR) 100 MG tablet Take 1 tablet (100 mg total) by mouth daily.   magnesium hydroxide (MILK OF MAGNESIA) 400 MG/5ML suspension Take 5 mLs by mouth daily as needed for mild constipation.   metoprolol succinate (TOPROL-XL) 25 MG 24 hr tablet Take 1 tablet (25 mg total) by mouth 2 (two) times daily.   mirtazapine (REMERON) 30 MG tablet Take 1 tablet (30 mg total) by mouth at bedtime.   nystatin (MYCOSTATIN/NYSTOP) powder Apply 1 Application topically 2 (two) times daily as needed.   ondansetron (ZOFRAN) 4 MG tablet Take 4 mg by mouth every 8 (eight) hours as needed for nausea or vomiting.   OXYGEN 2lpm for dyspnea or SOB   polyethylene glycol (MIRALAX / GLYCOLAX) 17 g packet Take 17 g by mouth as needed.   vitamin B-12 (CYANOCOBALAMIN) 1000 MCG tablet Take 1,000 mcg by mouth daily.   No facility-administered encounter medications on file as of 12/26/2022.    Review of Systems  Immunization History  Administered Date(s) Administered   Moderna Sars-Covid-2 Vaccination 01/25/2019, 02/24/2019, 12/06/2019, 06/11/2020   PNEUMOCOCCAL CONJUGATE-20 11/03/2021   Respiratory Syncytial Virus Vaccine,Recomb Aduvanted(Arexvy) 11/03/2021   Zoster Recombinant(Shingrix) 07/31/2017, 10/02/2017   Pertinent  Health Maintenance Due  Topic Date Due   INFLUENZA VACCINE  Never done   DEXA SCAN  Discontinued      08/11/2021    9:33 AM 10/29/2021    1:21 PM 03/07/2022    2:24 PM 03/29/2022   10:46 AM 11/01/2022    2:13 PM  Fall Risk  Falls in the past year? 0 0 1 1 0  Was there an injury with Fall?  0 0 0   Fall Risk Category Calculator  0 2 1   Fall Risk Category (Retired)  Low     (RETIRED) Patient Fall Risk Level  Low fall risk     Patient at Risk for Falls Due to  No Fall Risks History of fall(s) No Fall Risks Impaired balance/gait  Fall risk Follow up  Falls  evaluation completed Falls evaluation completed Falls evaluation completed Education provided   Functional Status Survey:    Vitals:   12/26/22 1343  BP: 137/76  Pulse: 80  Temp: 97.7 F (36.5 C)  SpO2: 99%   There is no height or weight on file to calculate BMI. Physical Exam Cardiovascular:     Rate and Rhythm: Normal rate and regular rhythm.     Pulses: Normal pulses.  Pulmonary:     Effort: Pulmonary effort is normal.     Breath sounds: Normal breath sounds.  Abdominal:     General: Abdomen is flat.  Palpations: Abdomen is soft.     Tenderness: There is right CVA tenderness and left CVA tenderness.  Skin:    General: Skin is warm.  Neurological:     General: No focal deficit present.     Mental Status: She is alert and oriented to person, place, and time.     Labs reviewed: Recent Labs    03/31/22 0851 06/27/22 0000  NA 134* 135*  K 3.8 3.7  CL 97* 99  CO2 30 27*  GLUCOSE 102*  --   BUN 14 14  CREATININE 0.81 0.7  CALCIUM 10.0 9.7   Recent Labs    03/31/22 0851 06/27/22 0000  AST 14 15  ALT 8 7  ALKPHOS  --  52  BILITOT 0.8  --   PROT 7.4  --   ALBUMIN  --  4.4   Recent Labs    03/31/22 0851 06/27/22 0000  WBC 5.0 3.6  NEUTROABS 2,335 1,760.00  HGB 12.4 11.6*  HCT 37.8 34*  MCV 94.5  --   PLT 166 167   Lab Results  Component Value Date   TSH 3.31 08/15/2022   No results found for: "HGBA1C" Lab Results  Component Value Date   CHOL 154 03/31/2022   HDL 54 03/31/2022   LDLCALC 79 03/31/2022   TRIG 109 03/31/2022   CHOLHDL 2.9 03/31/2022    Significant Diagnostic Results in last 30 days:  No results found.  Assessment/Plan Primary hypertension  MDD (major depressive disorder), single episode, in full remission (HCC)  Diarrhea, unspecified type  Acute midline low back pain without sciatica Patient with hx of hypertension, BP within goal range at this time. Continue hydrochlorothiazide, amolodipine, metoprolol, and  losartan for BP. CMP to evaluate for renal function and electrolytes. Patient with 2 days of diarrhea and lower back pain. Denies dysuria at this time. Given hx of [place], will collect UA and culture.  Denies blood in stool at this time, will collect CBC to assess for anemia given use of plavix. Patient states her mood has been stable, continue buspar and remeron at this time. Most recent thyroid level at goal. Start treatment based on culture findings and follow up lab results.   Family/ staff Communication: Mychart message to son, nursing  Labs/tests ordered:  CBC, CMP, US/CS.

## 2022-12-29 ENCOUNTER — Encounter: Payer: Self-pay | Admitting: Student

## 2022-12-29 ENCOUNTER — Other Ambulatory Visit: Payer: Self-pay | Admitting: Nurse Practitioner

## 2022-12-29 LAB — CBC: RBC: 3.27 — AB (ref 3.87–5.11)

## 2022-12-29 LAB — CBC AND DIFFERENTIAL
HCT: 31 — AB (ref 36–46)
Hemoglobin: 10.1 — AB (ref 12.0–16.0)
Neutrophils Absolute: 12845
Platelets: 205 10*3/uL (ref 150–400)
WBC: 15.8

## 2022-12-29 LAB — COMPREHENSIVE METABOLIC PANEL
Albumin: 4.2 (ref 3.5–5.0)
Calcium: 9.9 (ref 8.7–10.7)
Globulin: 2.5
eGFR: 36

## 2022-12-29 LAB — BASIC METABOLIC PANEL
BUN: 26 — AB (ref 4–21)
CO2: 26 — AB (ref 13–22)
Chloride: 94 — AB (ref 99–108)
Creatinine: 1.4 — AB (ref 0.5–1.1)
Glucose: 72
Potassium: 3.3 meq/L — AB (ref 3.5–5.1)
Sodium: 132 — AB (ref 137–147)

## 2022-12-29 LAB — HEPATIC FUNCTION PANEL
ALT: 26 U/L (ref 7–35)
AST: 18 (ref 13–35)
Alkaline Phosphatase: 87 (ref 25–125)

## 2022-12-29 MED ORDER — AMOXICILLIN-POT CLAVULANATE 875-125 MG PO TABS
1.0000 | ORAL_TABLET | Freq: Two times a day (BID) | ORAL | 0 refills | Status: DC
Start: 2022-12-29 — End: 2023-10-05

## 2022-12-29 NOTE — Progress Notes (Signed)
CULTURE, URINE, ROUTINE       Micro Number:      [id number]   Test Status:       Final   Specimen Source:   Urine, clean catch   Specimen Quality:  Adequate   Result:            Greater than 100,000 CFU/mL of Klebsiella oxytoca Sensitivity to Augmentin

## 2023-04-04 ENCOUNTER — Non-Acute Institutional Stay: Payer: Self-pay | Admitting: Nurse Practitioner

## 2023-04-04 ENCOUNTER — Encounter: Payer: Self-pay | Admitting: Nurse Practitioner

## 2023-04-04 DIAGNOSIS — G629 Polyneuropathy, unspecified: Secondary | ICD-10-CM

## 2023-04-04 DIAGNOSIS — I872 Venous insufficiency (chronic) (peripheral): Secondary | ICD-10-CM

## 2023-04-04 DIAGNOSIS — I10 Essential (primary) hypertension: Secondary | ICD-10-CM | POA: Diagnosis not present

## 2023-04-04 DIAGNOSIS — F419 Anxiety disorder, unspecified: Secondary | ICD-10-CM

## 2023-04-04 DIAGNOSIS — K5904 Chronic idiopathic constipation: Secondary | ICD-10-CM | POA: Diagnosis not present

## 2023-04-04 DIAGNOSIS — F325 Major depressive disorder, single episode, in full remission: Secondary | ICD-10-CM

## 2023-04-04 DIAGNOSIS — D649 Anemia, unspecified: Secondary | ICD-10-CM

## 2023-04-04 DIAGNOSIS — E039 Hypothyroidism, unspecified: Secondary | ICD-10-CM | POA: Diagnosis not present

## 2023-04-04 NOTE — Progress Notes (Signed)
 Location:  Other [facility].  Nursing Home Room Number: [facility] [facility] [address] Place of Service:  ALF [phone]) [name], NP  PCP: Earnestine Mealing, MD  Patient Care Team: Earnestine Mealing, MD as PCP - General (Family Medicine)  Extended Emergency Contact Information Primary Emergency Contact: Vasconcelos,Bill Mobile Phone: [phone] Relation: Son  Goals of care: Advanced Directive information    11/01/2022    2:16 PM  Advanced Directives  Does Patient Have a Medical Advance Directive? Yes  Type of Estate agent of [place];Out of facility DNR (pink MOST or yellow form)  Does patient want to make changes to medical advance directive? No - Patient declined  Copy of Healthcare Power of Attorney in Chart? Yes - validated most recent copy scanned in chart (See row information)  Pre-existing out of facility DNR order (yellow form or pink MOST form) Yellow form placed in chart (order not valid for inpatient use)     Chief Complaint  Patient presents with   Medical Management of Chronic Issues    Medical Management of Chronic Issues.    Discussed the use of AI scribe software for clinical note transcription with the patient, who gave verbal consent to proceed.  HPI:  Pt is a 89 y.o. female seen today for medical management of chronic disease.   She is accompanied by her son. She is doing wlel.   She experiences ankle swelling, particularly in the afternoon, which reduces with leg elevation. She is on a diuretic for blood pressure management and consumes water and Gatorade in the morning with her medication.  She experiences constipation about once a week, followed by loose stools. She uses Clearlax from [facility], taking a small amount to avoid diarrhea, and is also on Colace daily.  She has neuropathy and takes gabapentin at bedtime, which is effective. For breakthrough symptoms, she takes Tylenol, which provides relief. She receives extended-release Tylenol at  night.  No increase in anxiety or depression. She is on medication for anxiety and takes Remeron for depression at bedtime, aiding her sleep. She reports sleeping well and having a stable mood.  She tries to avoid heavy foods at dinner to prevent indigestion and is eating more salads. No indigestion reported.   Past Medical History:  Diagnosis Date   Carotid arterial disease (HCC)    GERD (gastroesophageal reflux disease)    Hyperlipidemia    Hypertension    Hypothyroidism    MDD (major depressive disorder), single episode, in full remission (HCC)    Polyneuropathy    Past Surgical History:  Procedure Laterality Date   ORIF DISTAL RADIUS FRACTURE     with redo twice   TOTAL HIP ARTHROPLASTY Right ~2011    Allergies  Allergen Reactions   Avelox [Moxifloxacin]    Levaquin [Levofloxacin]     Outpatient Encounter Medications as of 04/04/2023  Medication Sig   acetaminophen (TYLENOL) 325 MG tablet Take 650 mg by mouth every 4 (four) hours as needed.   acetaminophen (TYLENOL) 650 MG CR tablet Take 650 mg by mouth at bedtime.   aluminum-magnesium hydroxide 200-200 MG/5ML suspension Take 5 mLs by mouth every 4 (four) hours as needed for indigestion.   amLODipine (NORVASC) 5 MG tablet Take 5 mg by mouth daily.   atorvastatin (LIPITOR) 10 MG tablet Take 1 tablet (10 mg total) by mouth daily.   bismuth subsalicylate (PEPTO BISMOL) 262 MG/15ML suspension Take 30 mLs by mouth as needed.   busPIRone (BUSPAR) 5 MG tablet Take 1 tablet (5 mg  total) by mouth 2 (two) times daily as needed (ANXIETY).   Calcium Carbonate (CALCIUM 600 PO) Take 1 capsule by mouth daily.   carbamide peroxide (DEBROX) 6.5 % OTIC solution Place 5 drops into both ears as needed.   cetirizine (ZYRTEC) 5 MG tablet Take 5 mg by mouth daily.   Cholecalciferol (VITAMIN D3) 50 MCG (2000 UT) TABS Give one by mouth once daily.   clopidogrel (PLAVIX) 75 MG tablet Take 1 tablet (75 mg total) by mouth daily.   docusate sodium  (COLACE) 100 MG capsule Take 100 mg by mouth daily.   gabapentin (NEURONTIN) 400 MG capsule TAKE TWO CAPSULES BY MOUTH AT BEDTIME   Glucose 15 GM/32ML GEL Take 1 packet by mouth as needed (for low blood sugar).   hydrochlorothiazide (MICROZIDE) 12.5 MG capsule Take 12.5 mg by mouth daily.   levothyroxine (SYNTHROID) 88 MCG tablet Take 88 mcg by mouth daily before breakfast.   losartan (COZAAR) 100 MG tablet Take 1 tablet (100 mg total) by mouth daily.   magnesium hydroxide (MILK OF MAGNESIA) 400 MG/5ML suspension Take 5 mLs by mouth daily as needed for mild constipation.   metoprolol succinate (TOPROL-XL) 25 MG 24 hr tablet Take 1 tablet (25 mg total) by mouth 2 (two) times daily.   mirtazapine (REMERON) 30 MG tablet Take 1 tablet (30 mg total) by mouth at bedtime.   nystatin (MYCOSTATIN/NYSTOP) powder Apply 1 Application topically 2 (two) times daily as needed.   ondansetron (ZOFRAN) 4 MG tablet Take 4 mg by mouth every 8 (eight) hours as needed for nausea or vomiting.   OXYGEN 2lpm for dyspnea or SOB   polyethylene glycol (MIRALAX / GLYCOLAX) 17 g packet Take 17 g by mouth as needed.   vitamin B-12 (CYANOCOBALAMIN) 1000 MCG tablet Take 1,000 mcg by mouth daily.   amoxicillin-clavulanate (AUGMENTIN) 875-125 MG tablet Take 1 tablet by mouth 2 (two) times daily.   dextromethorphan-guaiFENesin (ROBITUSSIN-DM) 10-100 MG/5ML liquid Take 5 mLs by mouth every 4 (four) hours as needed for cough. (Patient not taking: Reported on 04/04/2023)   No facility-administered encounter medications on file as of 04/04/2023.    Review of Systems  Constitutional:  Negative for activity change, appetite change, fatigue and unexpected weight change.  HENT:  Positive for hearing loss. Negative for congestion.   Eyes: Negative.   Respiratory:  Negative for cough and shortness of breath.   Cardiovascular:  Negative for chest pain, palpitations and leg swelling.  Gastrointestinal:  Negative for abdominal pain,  constipation and diarrhea.  Genitourinary:  Negative for difficulty urinating and dysuria.  Musculoskeletal:  Negative for arthralgias and myalgias.  Skin:  Negative for color change and wound.  Neurological:  Negative for dizziness and weakness.  Psychiatric/Behavioral:  Negative for agitation, behavioral problems and confusion.      Immunization History  Administered Date(s) Administered   Influenza-Unspecified 11/11/2022   Moderna Sars-Covid-2 Vaccination 01/25/2019, 02/24/2019, 12/06/2019, 06/11/2020, 12/08/2022   PNEUMOCOCCAL CONJUGATE-20 11/03/2021   Respiratory Syncytial Virus Vaccine,Recomb Aduvanted(Arexvy) 11/03/2021   Tdap 11/02/2022   Zoster Recombinant(Shingrix) 07/31/2017, 10/02/2017   Pertinent  Health Maintenance Due  Topic Date Due   INFLUENZA VACCINE  Completed   DEXA SCAN  Discontinued      08/11/2021    9:33 AM 10/29/2021    1:21 PM 03/07/2022    2:24 PM 03/29/2022   10:46 AM 11/01/2022    2:13 PM  Fall Risk  Falls in the past year? 0 0 1 1 0  Was there an injury  with Fall?  0 0 0   Fall Risk Category Calculator  0 2 1   Fall Risk Category (Retired)  Low     (RETIRED) Patient Fall Risk Level  Low fall risk     Patient at Risk for Falls Due to  No Fall Risks History of fall(s) No Fall Risks Impaired balance/gait  Fall risk Follow up  Falls evaluation completed Falls evaluation completed Falls evaluation completed Education provided   Functional Status Survey:    Vitals:   04/04/23 1524  BP: 110/67  Pulse: 76  Resp: 16  Temp: 98.2 F (36.8 C)  SpO2: 94%  Weight: 155 lb 8 oz (70.5 kg)  Height: 5\' 8"  (1.727 m)   Body mass index is 23.64 kg/m. Physical Exam Constitutional:      General: She is not in acute distress.    Appearance: She is well-developed. She is not diaphoretic.  HENT:     Head: Normocephalic and atraumatic.     Mouth/Throat:     Pharynx: No oropharyngeal exudate.  Eyes:     Conjunctiva/sclera: Conjunctivae normal.     Pupils:  Pupils are equal, round, and reactive to light.  Cardiovascular:     Rate and Rhythm: Normal rate and regular rhythm.     Heart sounds: Normal heart sounds.  Pulmonary:     Effort: Pulmonary effort is normal.     Breath sounds: Normal breath sounds.  Abdominal:     General: Bowel sounds are normal.     Palpations: Abdomen is soft.  Musculoskeletal:     Cervical back: Normal range of motion and neck supple.     Right lower leg: No edema.     Left lower leg: No edema.  Skin:    General: Skin is warm and dry.  Neurological:     Mental Status: She is alert.  Psychiatric:        Mood and Affect: Mood normal.     Labs reviewed: Recent Labs    06/27/22 0000 12/29/22 0000  NA 135* 132*  K 3.7 3.3*  CL 99 94*  CO2 27* 26*  BUN 14 26*  CREATININE 0.7 1.4*  CALCIUM 9.7 9.9   Recent Labs    06/27/22 0000 12/29/22 0000  AST 15 18  ALT 7 26  ALKPHOS 52 87  ALBUMIN 4.4 4.2   Recent Labs    06/27/22 0000 12/29/22 0000  WBC 3.6 15.8  NEUTROABS 1,760.00 12,845.00  HGB 11.6* 10.1*  HCT 34* 31*  PLT 167 205   Lab Results  Component Value Date   TSH 3.31 08/15/2022   No results found for: "HGBA1C" Lab Results  Component Value Date   CHOL 154 03/31/2022   HDL 54 03/31/2022   LDLCALC 79 03/31/2022   TRIG 109 03/31/2022   CHOLHDL 2.9 03/31/2022    Significant Diagnostic Results in last 30 days:  No results found.  Assessment/Plan Anemia No reports of blood loss, will follow up cbc - Recheck hemoglobin levels.  Hypertension Current diuretic may cause hypokalemia and renal impairment. Blood pressure is well controlled so will DC hydrochlorothiazide at this time and monitor BP - Recheck kidney function and potassium levels.  Venous Insufficiency Ankle edema likely due to venous insufficiency, worsens in afternoon, resolves overnight. - Advise wearing compression hose or socks. - Recommend leg elevation when resting.  Constipation Weekly constipation  followed by loose stools. Adjust Clearlax to manage symptoms without inducing diarrhea. - Adjust Clearlax dosage to half or every  other day as needed.  Hypothyroidism On Synthroid 88 mcg. TSH levels need re-evaluation. - Recheck TSH levels.  Peripheral Neuropathy Gabapentin at bedtime effectively controls symptoms. Uses Tylenol as needed. - Continue gabapentin at bedtime. - Continue Tylenol as needed for neuropathy pain.  Anxiety and Depression On Remeron at bedtime, effectively managing mood and sleep. - Continue current anxiety and depression medication regimen.    Janene Harvey. Biagio [facility] [phone]

## 2023-04-06 LAB — TSH: TSH: 3.8 (ref 0.41–5.90)

## 2023-04-06 LAB — BASIC METABOLIC PANEL WITH GFR
BUN: 10 (ref 4–21)
CO2: 30 — AB (ref 13–22)
Chloride: 98 — AB (ref 99–108)
Creatinine: 0.7 (ref 0.5–1.1)
Glucose: 64
Potassium: 4.1 meq/L (ref 3.5–5.1)
Sodium: 135 — AB (ref 137–147)

## 2023-04-06 LAB — HEPATIC FUNCTION PANEL
ALT: 7 U/L (ref 7–35)
AST: 16 (ref 13–35)
Alkaline Phosphatase: 59 (ref 25–125)
Bilirubin, Total: 0.5

## 2023-04-06 LAB — CBC AND DIFFERENTIAL
HCT: 32 — AB (ref 36–46)
Hemoglobin: 10.5 — AB (ref 12.0–16.0)
Neutrophils Absolute: 1776
Platelets: 191 10*3/uL (ref 150–400)
WBC: 4.6

## 2023-04-06 LAB — CBC: RBC: 3.34 — AB (ref 3.87–5.11)

## 2023-04-06 LAB — LIPID PANEL
Cholesterol: 140 (ref 0–200)
HDL: 50 (ref 35–70)
LDL Cholesterol: 74
Triglycerides: 78 (ref 40–160)

## 2023-04-06 LAB — COMPREHENSIVE METABOLIC PANEL WITH GFR
Albumin: 4.2 (ref 3.5–5.0)
Calcium: 9.8 (ref 8.7–10.7)
Globulin: 2.6

## 2023-04-24 ENCOUNTER — Encounter: Payer: Self-pay | Admitting: Student

## 2023-04-24 NOTE — Telephone Encounter (Signed)
Message routed to PCP Beamer, Victoria, MD  

## 2023-07-07 ENCOUNTER — Encounter: Payer: Self-pay | Admitting: Student

## 2023-07-07 NOTE — Progress Notes (Signed)
 This encounter was created in error - please disregard.

## 2023-07-10 ENCOUNTER — Encounter: Payer: Self-pay | Admitting: Student

## 2023-07-11 NOTE — Telephone Encounter (Signed)
 Assisted Living Resident @ [facility]

## 2023-08-22 ENCOUNTER — Encounter: Payer: Self-pay | Admitting: Nurse Practitioner

## 2023-08-22 NOTE — Progress Notes (Deleted)
 Location:      Place of Service:     Abdul Fine, MD  Patient Care Team: Abdul Fine, MD as PCP - General (Family Medicine)  Extended Emergency Contact Information Primary Emergency Contact: Rumsey,Bill Mobile Phone: [phone] Relation: Son  Goals of care: Advanced Directive information    11/01/2022    2:16 PM  Advanced Directives  Does Patient Have a Medical Advance Directive? Yes  Type of Estate agent of [place];Out of facility DNR (pink MOST or yellow form)  Does patient want to make changes to medical advance directive? No - Patient declined  Copy of Healthcare Power of Attorney in Chart? Yes - validated most recent copy scanned in chart (See row information)  Pre-existing out of facility DNR order (yellow form or pink MOST form) Yellow form placed in chart (order not valid for inpatient use)     No chief complaint on file.   HPI:  Pt is a 89 y.o. female seen today for an acute visit for ***   Past Medical History:  Diagnosis Date   Carotid arterial disease (HCC)    GERD (gastroesophageal reflux disease)    Hyperlipidemia    Hypertension    Hypothyroidism    MDD (major depressive disorder), single episode, in full remission (HCC)    Polyneuropathy    Past Surgical History:  Procedure Laterality Date   ORIF DISTAL RADIUS FRACTURE     with redo twice   TOTAL HIP ARTHROPLASTY Right ~2011    Allergies  Allergen Reactions   Avelox [Moxifloxacin]    Levaquin [Levofloxacin]     Outpatient Encounter Medications as of 08/22/2023  Medication Sig   acetaminophen (TYLENOL) 325 MG tablet Take 650 mg by mouth every 4 (four) hours as needed.   acetaminophen (TYLENOL) 650 MG CR tablet Take 650 mg by mouth at bedtime.   aluminum-magnesium hydroxide 200-200 MG/5ML suspension Take 5 mLs by mouth every 4 (four) hours as needed for indigestion.   amLODipine (NORVASC) 5 MG tablet Take 5 mg by mouth daily.   amoxicillin -clavulanate  (AUGMENTIN ) 875-125 MG tablet Take 1 tablet by mouth 2 (two) times daily. (Patient not taking: Reported on 07/07/2023)   atorvastatin  (LIPITOR) 10 MG tablet Take 1 tablet (10 mg total) by mouth daily.   bismuth subsalicylate (PEPTO BISMOL) 262 MG/15ML suspension Take 30 mLs by mouth as needed.   busPIRone  (BUSPAR ) 5 MG tablet Take 1 tablet (5 mg total) by mouth 2 (two) times daily as needed (ANXIETY).   busPIRone  (BUSPAR ) 5 MG tablet Take 5 mg by mouth 2 (two) times daily.   Calcium  Carbonate (CALCIUM  600 PO) Take 1 capsule by mouth daily.   carbamide peroxide (DEBROX) 6.5 % OTIC solution Place 5 drops into both ears as needed.   cetirizine (ZYRTEC) 5 MG tablet Take 5 mg by mouth daily.   Cholecalciferol (VITAMIN D3) 50 MCG (2000 UT) TABS Give one by mouth once daily.   clopidogrel  (PLAVIX ) 75 MG tablet Take 1 tablet (75 mg total) by mouth daily.   dextromethorphan-guaiFENesin (ROBITUSSIN-DM) 10-100 MG/5ML liquid Take 5 mLs by mouth every 4 (four) hours as needed for cough.   docusate sodium (COLACE) 100 MG capsule Take 100 mg by mouth daily.   gabapentin  (NEURONTIN ) 400 MG capsule TAKE TWO CAPSULES BY MOUTH AT BEDTIME   Glucose 15 GM/32ML GEL Take 1 packet by mouth as needed (for low blood sugar).   hydrochlorothiazide  (MICROZIDE ) 12.5 MG capsule Take 12.5 mg by mouth daily.   levothyroxine  (SYNTHROID )  88 MCG tablet Take 88 mcg by mouth daily before breakfast.   losartan  (COZAAR ) 100 MG tablet Take 1 tablet (100 mg total) by mouth daily.   magnesium hydroxide (MILK OF MAGNESIA) 400 MG/5ML suspension Take 5 mLs by mouth daily as needed for mild constipation.   metoprolol  succinate (TOPROL -XL) 25 MG 24 hr tablet Take 1 tablet (25 mg total) by mouth 2 (two) times daily.   mirtazapine  (REMERON ) 30 MG tablet Take 1 tablet (30 mg total) by mouth at bedtime.   nystatin (MYCOSTATIN/NYSTOP) powder Apply 1 Application topically 2 (two) times daily as needed.   ondansetron  (ZOFRAN ) 4 MG tablet Take 4 mg by  mouth every 8 (eight) hours as needed for nausea or vomiting.   OXYGEN 2lpm for dyspnea or SOB   polyethylene glycol (MIRALAX / GLYCOLAX) 17 g packet Take 17 g by mouth as needed.   vitamin B-12 (CYANOCOBALAMIN) 1000 MCG tablet Take 1,000 mcg by mouth daily.   No facility-administered encounter medications on file as of 08/22/2023.    Review of Systems***  Immunization History  Administered Date(s) Administered   Influenza-Unspecified 11/11/2022   Moderna Covid-19 Fall Seasonal Vaccine 47yrs & older 05/05/2023   Moderna Sars-Covid-2 Vaccination 01/25/2019, 02/24/2019, 12/06/2019, 06/11/2020, 12/08/2022   PNEUMOCOCCAL CONJUGATE-20 11/03/2021   Respiratory Syncytial Virus Vaccine,Recomb Aduvanted(Arexvy) 11/03/2021   Tdap 11/02/2022   Zoster Recombinant(Shingrix) 07/31/2017, 10/02/2017   Pertinent  Health Maintenance Due  Topic Date Due   INFLUENZA VACCINE  08/25/2023   DEXA SCAN  Discontinued      08/11/2021    9:33 AM 10/29/2021    1:21 PM 03/07/2022    2:24 PM 03/29/2022   10:46 AM 11/01/2022    2:13 PM  Fall Risk  Falls in the past year? 0 0 1 1 0  Was there an injury with Fall?  0 0 0   Fall Risk Category Calculator  0 2 1   Fall Risk Category (Retired)  Low      (RETIRED) Patient Fall Risk Level  Low fall risk      Patient at Risk for Falls Due to  No Fall Risks History of fall(s) No Fall Risks Impaired balance/gait  Fall risk Follow up  Falls evaluation completed  Falls evaluation completed Falls evaluation completed Education provided     Data saved with a previous flowsheet row definition   Functional Status Survey:    There were no vitals filed for this visit. There is no height or weight on file to calculate BMI. Physical Exam***  Labs reviewed: Recent Labs    12/29/22 0000 04/06/23 0000  NA 132* 135*  K 3.3* 4.1  CL 94* 98*  CO2 26* 30*  BUN 26* 10  CREATININE 1.4* 0.7  CALCIUM  9.9 9.8   Recent Labs    12/29/22 0000 04/06/23 0000  AST 18 16  ALT  26 7  ALKPHOS 87 59  ALBUMIN 4.2 4.2   Recent Labs    12/29/22 0000 04/06/23 0000  WBC 15.8 4.6  NEUTROABS 12,845.00 1,776.00  HGB 10.1* 10.5*  HCT 31* 32*  PLT 205 191   Lab Results  Component Value Date   TSH 3.80 04/06/2023   No results found for: HGBA1C Lab Results  Component Value Date   CHOL 140 04/06/2023   HDL 50 04/06/2023   LDLCALC 74 04/06/2023   TRIG 78 04/06/2023   CHOLHDL 2.9 03/31/2022    Significant Diagnostic Results in last 30 days:  No results found.  Assessment/Plan No  problem-specific Assessment & Plan notes found for this encounter.    Tiajuana Leppanen K. Caro BODILY [facility] [phone] This encounter was created in error - please disregard.

## 2023-08-22 NOTE — Progress Notes (Signed)
 error

## 2023-10-02 ENCOUNTER — Non-Acute Institutional Stay: Payer: Self-pay | Admitting: Student

## 2023-10-02 DIAGNOSIS — R600 Localized edema: Secondary | ICD-10-CM

## 2023-10-02 DIAGNOSIS — F419 Anxiety disorder, unspecified: Secondary | ICD-10-CM

## 2023-10-02 DIAGNOSIS — E785 Hyperlipidemia, unspecified: Secondary | ICD-10-CM

## 2023-10-02 DIAGNOSIS — E039 Hypothyroidism, unspecified: Secondary | ICD-10-CM | POA: Diagnosis not present

## 2023-10-02 DIAGNOSIS — I10 Essential (primary) hypertension: Secondary | ICD-10-CM | POA: Diagnosis not present

## 2023-10-02 DIAGNOSIS — H903 Sensorineural hearing loss, bilateral: Secondary | ICD-10-CM

## 2023-10-02 DIAGNOSIS — G629 Polyneuropathy, unspecified: Secondary | ICD-10-CM

## 2023-10-02 DIAGNOSIS — G3184 Mild cognitive impairment, so stated: Secondary | ICD-10-CM

## 2023-10-03 NOTE — Progress Notes (Signed)
 Location:  Other   Place of Service:  ALF (13) Provider:  Abdul Abdul Fine, MD  Patient Care Team: Abdul Fine, MD as PCP - General (Family Medicine)  Extended Emergency Contact Information Primary Emergency Contact: Cordaro,Bill Mobile Phone: [phone] Relation: Son NR Goals of care: Advanced Directive information    11/01/2022    2:16 PM  Advanced Directives  Does Patient Have a Medical Advance Directive? Yes  Type of Estate agent of [place];Out of facility DNR (pink MOST or yellow form)  Does patient want to make changes to medical advance directive? No - Patient declined  Copy of Healthcare Power of Attorney in Chart? Yes - validated most recent copy scanned in chart (See row information)  Pre-existing out of facility DNR order (yellow form or pink MOST form) Yellow form placed in chart (order not valid for inpatient use)     Chief Complaint  Patient presents with   Medical Management of Chronic Issues    HPI:  Pt is a 89 y.o. female seen today for an routine visit for  Discussed the use of AI scribe software for clinical note transcription with the patient, who gave verbal consent to proceed.  History of Present Illness   History of Present Illness The patient is a 89 year old who presents for a routine visit. She is accompanied by her son, Zell.  She experiences situational anxiety, particularly related to scheduling and lack of structure, which has been a long-standing issue since her time in independent living. Her son notes that she is not anxious daily.  She mentions occasional sleep disturbances, although she generally sleeps well. Her son notes that she sometimes reports not sleeping well, but this is not a consistent issue. She takes gabapentin  occasionally for leg pain, which helps her sleep when her legs do not hurt.  She reports some swelling in her ankles, with the left being worse than the right. She has tried  compression stockings in the past but found them uncomfortable. Her son mentions that she has some leftover stockings from his hip surgery, but they are too long for her preference.  She is currently on losartan  100 mg, amlodipine, and metoprolol  for blood pressure management. Her son notes that she was previously on amlodipine. Her blood pressure readings have been high at times, but she does not experience headaches.  Past Medical History - Anxiety disorder - Hypertension - Peripheral edema - Hearing loss - Neuropathy  Surgical History: - Hip surgery   Medications - Gabapentin  - Losartan  100 mg - Amlodipine  Social History - Living Situation: Assisted living facility - The patient is 89 years old and resides in an assisted living facility. She enjoys puzzles and has some anxiety related to scheduling. She is social and interacts with others in common areas. She has some hearing difficulties which affect her social interactions.    Past Medical History:  Diagnosis Date   Carotid arterial disease (HCC)    GERD (gastroesophageal reflux disease)    Hyperlipidemia    Hypertension    Hypothyroidism    MDD (major depressive disorder), single episode, in full remission (HCC)    Polyneuropathy    Past Surgical History:  Procedure Laterality Date   ORIF DISTAL RADIUS FRACTURE     with redo twice   TOTAL HIP ARTHROPLASTY Right ~2011    Allergies  Allergen Reactions   Avelox [Moxifloxacin]    Levaquin [Levofloxacin]     Outpatient Encounter Medications as of 10/02/2023  Medication Sig  acetaminophen (TYLENOL) 325 MG tablet Take 650 mg by mouth every 4 (four) hours as needed.   acetaminophen (TYLENOL) 650 MG CR tablet Take 650 mg by mouth at bedtime.   aluminum-magnesium hydroxide 200-200 MG/5ML suspension Take 5 mLs by mouth every 4 (four) hours as needed for indigestion.   amLODipine (NORVASC) 5 MG tablet Take 5 mg by mouth daily.   amoxicillin -clavulanate (AUGMENTIN )  875-125 MG tablet Take 1 tablet by mouth 2 (two) times daily. (Patient not taking: Reported on 07/07/2023)   atorvastatin  (LIPITOR) 10 MG tablet Take 1 tablet (10 mg total) by mouth daily.   bismuth subsalicylate (PEPTO BISMOL) 262 MG/15ML suspension Take 30 mLs by mouth as needed.   busPIRone  (BUSPAR ) 5 MG tablet Take 1 tablet (5 mg total) by mouth 2 (two) times daily as needed (ANXIETY).   busPIRone  (BUSPAR ) 5 MG tablet Take 5 mg by mouth 2 (two) times daily.   Calcium  Carbonate (CALCIUM  600 PO) Take 1 capsule by mouth daily.   carbamide peroxide (DEBROX) 6.5 % OTIC solution Place 5 drops into both ears as needed.   cetirizine (ZYRTEC) 5 MG tablet Take 5 mg by mouth daily.   Cholecalciferol (VITAMIN D3) 50 MCG (2000 UT) TABS Give one by mouth once daily.   clopidogrel  (PLAVIX ) 75 MG tablet Take 1 tablet (75 mg total) by mouth daily.   dextromethorphan-guaiFENesin (ROBITUSSIN-DM) 10-100 MG/5ML liquid Take 5 mLs by mouth every 4 (four) hours as needed for cough.   docusate sodium (COLACE) 100 MG capsule Take 100 mg by mouth daily.   gabapentin  (NEURONTIN ) 400 MG capsule TAKE TWO CAPSULES BY MOUTH AT BEDTIME   Glucose 15 GM/32ML GEL Take 1 packet by mouth as needed (for low blood sugar).   hydrochlorothiazide  (MICROZIDE ) 12.5 MG capsule Take 12.5 mg by mouth daily.   levothyroxine  (SYNTHROID ) 88 MCG tablet Take 88 mcg by mouth daily before breakfast.   losartan  (COZAAR ) 100 MG tablet Take 1 tablet (100 mg total) by mouth daily.   magnesium hydroxide (MILK OF MAGNESIA) 400 MG/5ML suspension Take 5 mLs by mouth daily as needed for mild constipation.   metoprolol  succinate (TOPROL -XL) 25 MG 24 hr tablet Take 1 tablet (25 mg total) by mouth 2 (two) times daily.   mirtazapine  (REMERON ) 30 MG tablet Take 1 tablet (30 mg total) by mouth at bedtime.   nystatin (MYCOSTATIN/NYSTOP) powder Apply 1 Application topically 2 (two) times daily as needed.   ondansetron  (ZOFRAN ) 4 MG tablet Take 4 mg by mouth every  8 (eight) hours as needed for nausea or vomiting.   OXYGEN 2lpm for dyspnea or SOB   polyethylene glycol (MIRALAX / GLYCOLAX) 17 g packet Take 17 g by mouth as needed.   vitamin B-12 (CYANOCOBALAMIN) 1000 MCG tablet Take 1,000 mcg by mouth daily.   No facility-administered encounter medications on file as of 10/02/2023.    Review of Systems  Immunization History  Administered Date(s) Administered   Influenza-Unspecified 11/11/2022   Moderna Covid-19 Fall Seasonal Vaccine 8yrs & older 05/05/2023   Moderna Sars-Covid-2 Vaccination 01/25/2019, 02/24/2019, 12/06/2019, 06/11/2020, 12/08/2022   PNEUMOCOCCAL CONJUGATE-20 11/03/2021   Respiratory Syncytial Virus Vaccine,Recomb Aduvanted(Arexvy) 11/03/2021   Tdap 11/02/2022   Zoster Recombinant(Shingrix) 07/31/2017, 10/02/2017   Pertinent  Health Maintenance Due  Topic Date Due   Influenza Vaccine  08/25/2023   DEXA SCAN  Discontinued      08/11/2021    9:33 AM 10/29/2021    1:21 PM 03/07/2022    2:24 PM 03/29/2022   10:46  AM 11/01/2022    2:13 PM  Fall Risk  Falls in the past year? 0 0 1 1 0  Was there an injury with Fall?  0 0 0   Fall Risk Category Calculator  0 2 1   Fall Risk Category (Retired)  Low      (RETIRED) Patient Fall Risk Level  Low fall risk      Patient at Risk for Falls Due to  No Fall Risks History of fall(s) No Fall Risks Impaired balance/gait  Fall risk Follow up  Falls evaluation completed  Falls evaluation completed Falls evaluation completed Education provided     Data saved with a previous flowsheet row definition   Functional Status Survey:    There were no vitals filed for this visit. There is no height or weight on file to calculate BMI. Physical Exam   Physical Exam VITALS: T- 97.8, P- 86, BP- 187/85 CHEST: Lungs clear to auscultation bilaterally. CARDIOVASCULAR: Heart regular rate and rhythm. Labs reviewed: Recent Labs    12/29/22 0000 04/06/23 0000  NA 132* 135*  K 3.3* 4.1  CL 94* 98*   CO2 26* 30*  BUN 26* 10  CREATININE 1.4* 0.7  CALCIUM  9.9 9.8   Recent Labs    12/29/22 0000 04/06/23 0000  AST 18 16  ALT 26 7  ALKPHOS 87 59  ALBUMIN 4.2 4.2   Recent Labs    12/29/22 0000 04/06/23 0000  WBC 15.8 4.6  NEUTROABS 12,845.00 1,776.00  HGB 10.1* 10.5*  HCT 31* 32*  PLT 205 191   Lab Results  Component Value Date   TSH 3.80 04/06/2023   No results found for: HGBA1C Lab Results  Component Value Date   CHOL 140 04/06/2023   HDL 50 04/06/2023   LDLCALC 74 04/06/2023   TRIG 78 04/06/2023   CHOLHDL 2.9 03/31/2022    Significant Diagnostic Results in last 30 days:  No results found.  Assessment/Plan Hypertension with lower extremity edema Blood pressure is elevated at 187/85 mmHg with reports of lower extremity edema, more pronounced in the left ankle. Currently on losartan  100 mg and amlodipine 5 mg. Amlodipine may exacerbate edema. - Monitor blood pressure over the next couple of weeks. - Consider adjusting antihypertensive medications based on blood pressure readings. - Measure for compression stockings and order appropriate size. - Perform blood work on Thursday.  Peripheral neuropathy Intermittent leg pain managed with gabapentin  as needed.  Situational anxiety Anxiety related to scheduling and structure, not constant, managed with low-dose medication as needed.  Cognitive impairment Diminished mental acuity, possibly related to hearing difficulties.  Hearing loss Hearing loss, particularly with higher-pitched voices, contributing to social interaction difficulties and potential impact on cognitive function.  Family/ staff Communication: nursing, son  Labs/tests ordered:  nursing  I spent greater than 40 minutes for the care of this patient in face to face time, chart review, clinical documentation, patient education.

## 2023-10-05 ENCOUNTER — Encounter: Payer: Self-pay | Admitting: Student

## 2023-10-09 LAB — COMPREHENSIVE METABOLIC PANEL WITH GFR
Albumin: 4.1 (ref 3.5–5.0)
Calcium: 9.7 (ref 8.7–10.7)
Globulin: 2.5
eGFR: 56

## 2023-10-09 LAB — BASIC METABOLIC PANEL WITH GFR
BUN: 20 (ref 4–21)
CO2: 30 — AB (ref 13–22)
Chloride: 100 (ref 99–108)
Creatinine: 1 (ref 0.5–1.1)
Glucose: 66
Potassium: 3.7 meq/L (ref 3.5–5.1)
Sodium: 138 (ref 137–147)

## 2023-10-09 LAB — CBC AND DIFFERENTIAL
HCT: 33 — AB (ref 36–46)
Hemoglobin: 10.7 — AB (ref 12.0–16.0)
Neutrophils Absolute: 1312
Platelets: 170 K/uL (ref 150–400)
WBC: 4

## 2023-10-09 LAB — HEPATIC FUNCTION PANEL
ALT: 7 U/L (ref 7–35)
AST: 15 (ref 13–35)
Alkaline Phosphatase: 67 (ref 25–125)
Bilirubin, Total: 0.4

## 2023-10-09 LAB — VITAMIN B12: Vitamin B-12: 996

## 2023-10-09 LAB — CBC: RBC: 3.42 — AB (ref 3.87–5.11)

## 2023-10-09 LAB — TSH: TSH: 4.14 (ref 0.41–5.90)

## 2023-10-09 LAB — HEMOGLOBIN A1C: Hemoglobin A1C: 5.3

## 2023-10-09 LAB — VITAMIN D 25 HYDROXY (VIT D DEFICIENCY, FRACTURES): Vit D, 25-Hydroxy: 19

## 2023-10-19 LAB — BASIC METABOLIC PANEL WITH GFR
BUN: 16 (ref 4–21)
CO2: 32 — AB (ref 13–22)
Chloride: 98 — AB (ref 99–108)
Creatinine: 0.9 (ref 0.5–1.1)
Glucose: 66
Potassium: 3.6 meq/L (ref 3.5–5.1)
Sodium: 137 (ref 137–147)

## 2023-10-19 LAB — COMPREHENSIVE METABOLIC PANEL WITH GFR
Calcium: 9.7 (ref 8.7–10.7)
eGFR: 62

## 2023-10-19 IMAGING — US US CAROTID DUPLEX BILAT
1 series · 14 of 24 positions shown · non-contrast
Comparison: None.

CLINICAL DATA: Hypertension

Visual disturbance
Hyperlipidemia
EXAM:
BILATERAL CAROTID DUPLEX ULTRASOUND
TECHNIQUE: Gray scale imaging, color Doppler and duplex ultrasound were
performed of bilateral carotid and vertebral arteries in the neck.

[Series 1: us carotid bilateral · 14 of 64 slices shown]
[im 1/64]
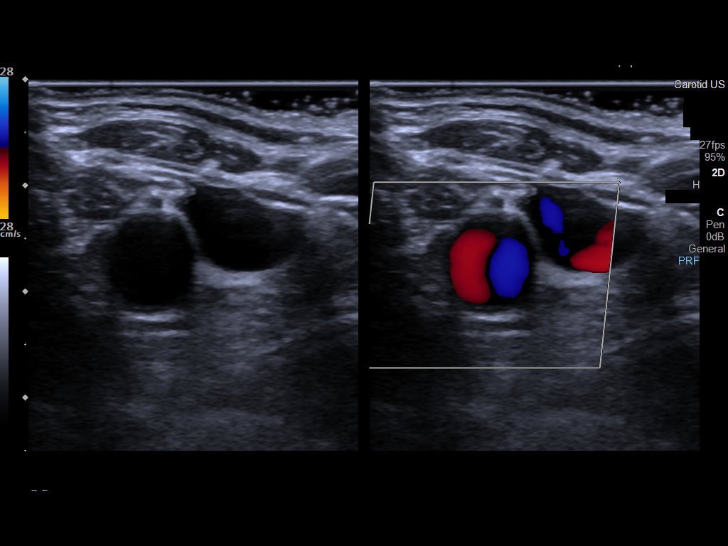
[im 6/64]
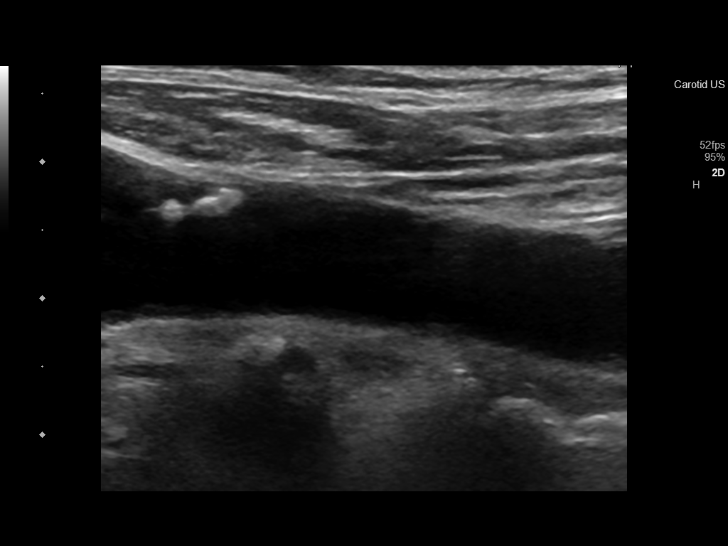
[im 11/64]
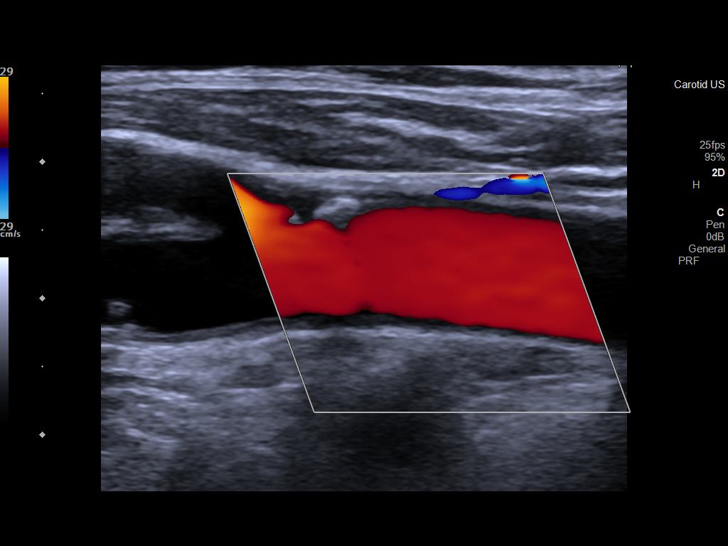
[im 17/64]
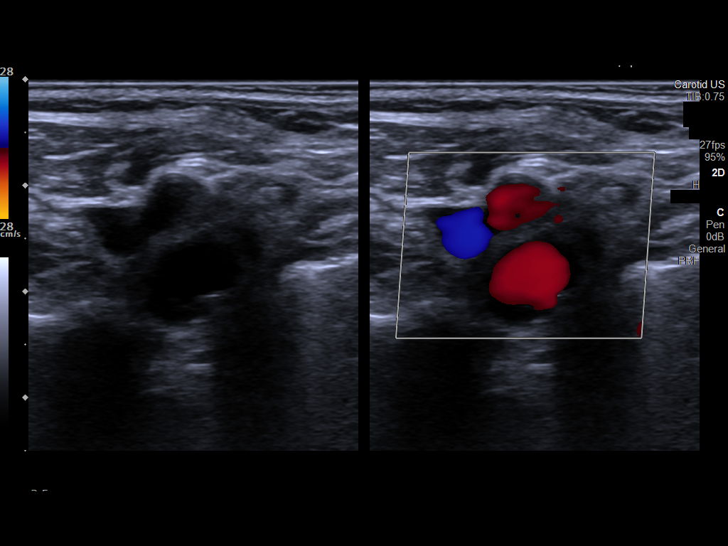
[im 20/64]
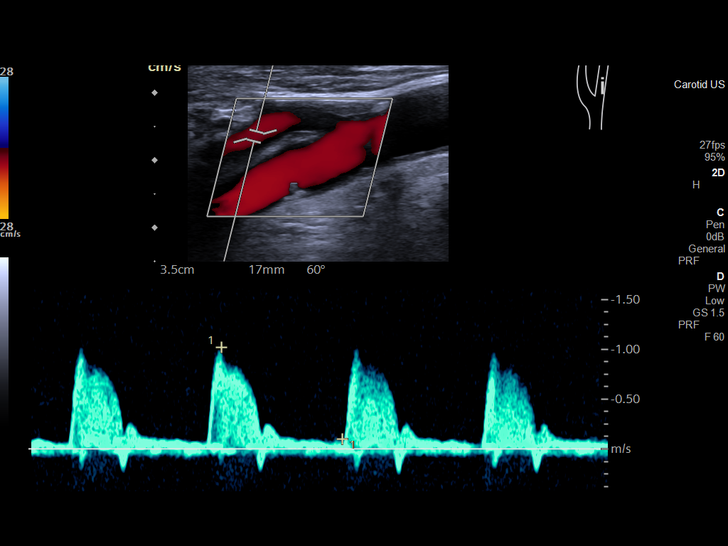
[im 25/64]
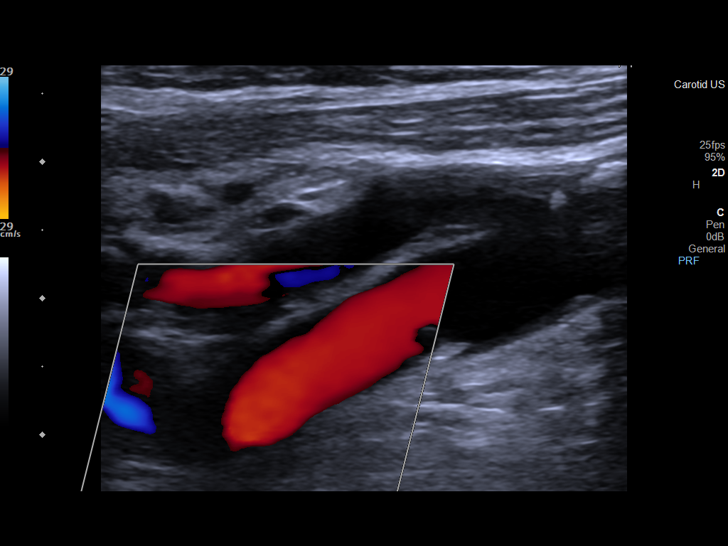
[im 31/64]
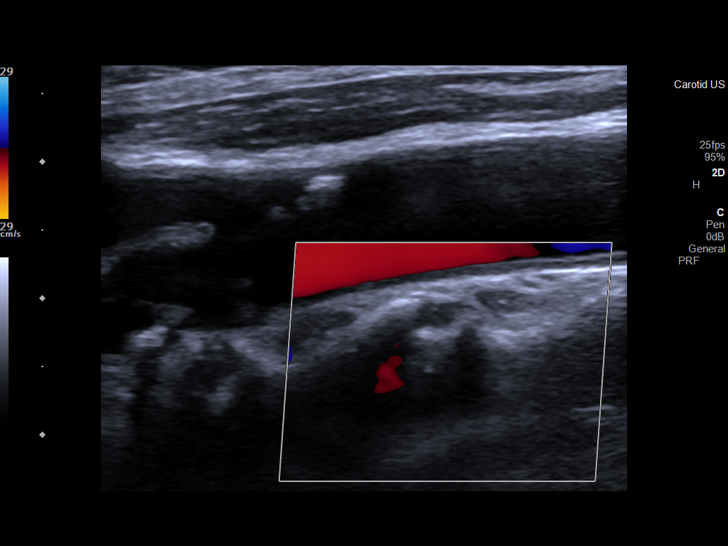
[im 33/64]
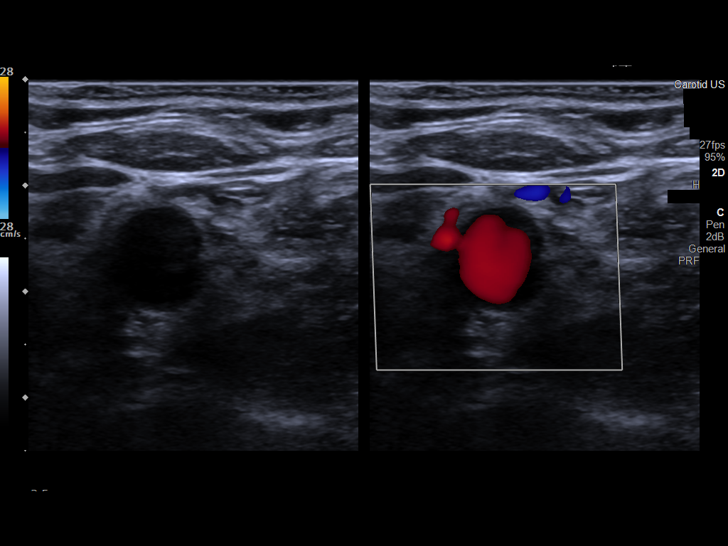
[im 39/64]
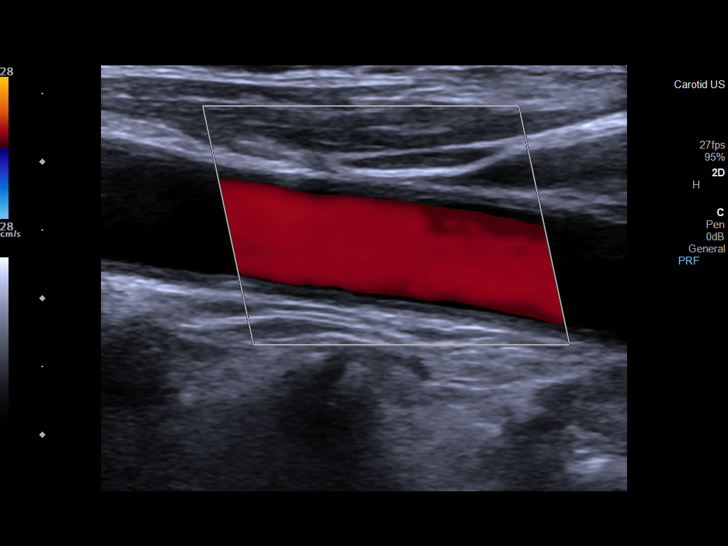
[im 44/64]
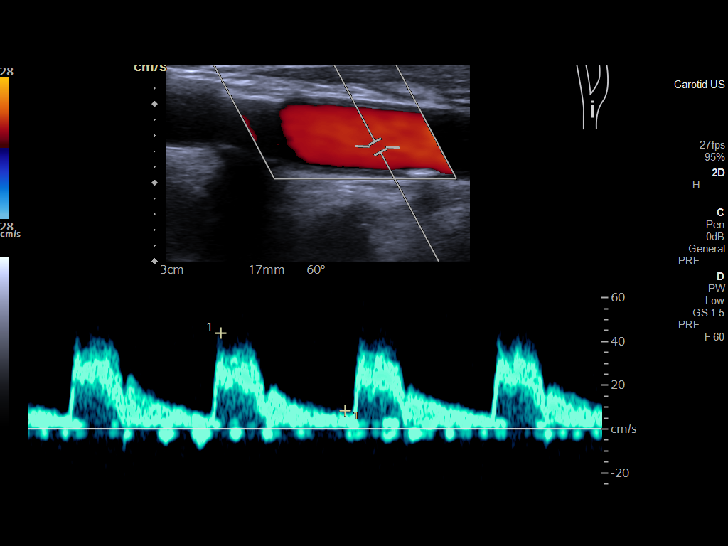
[im 50/64]
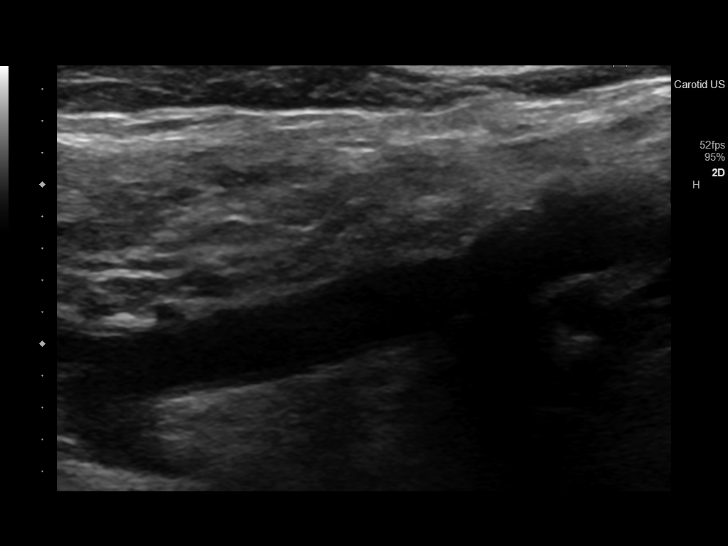
[im 53/64]
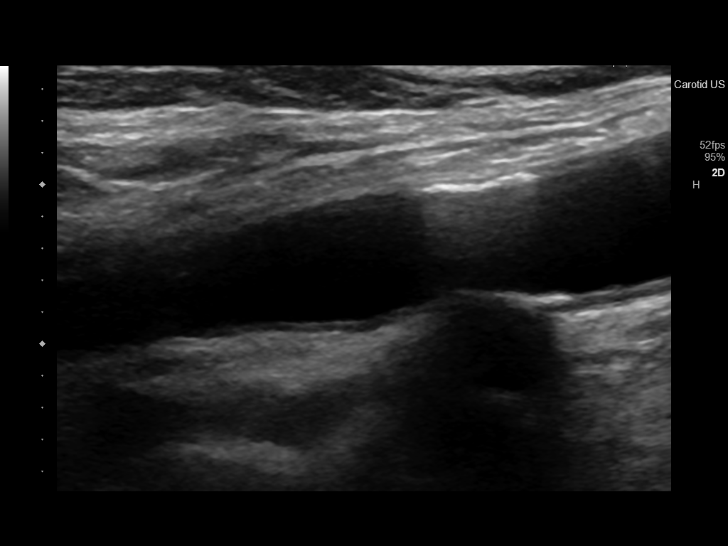
[im 58/64]
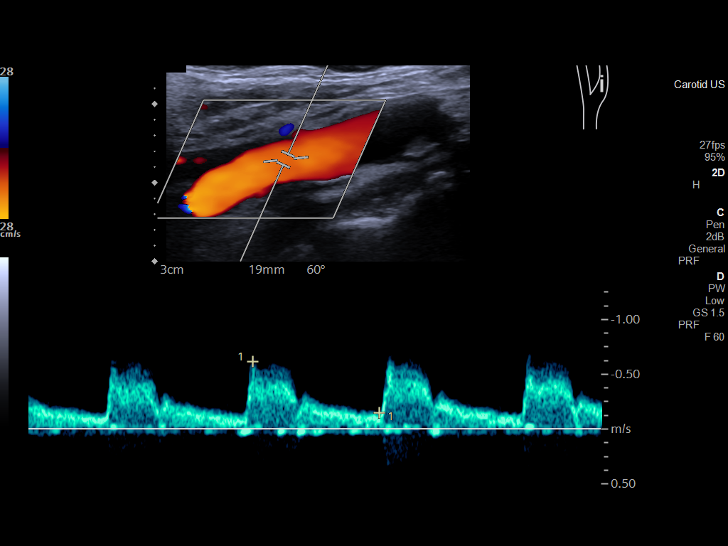
[im 64/64]
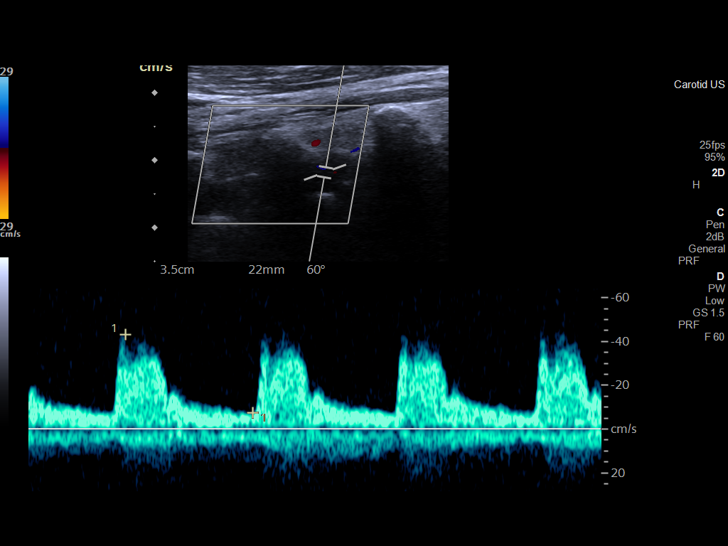

[14 of 24 positions shown; findings below may reference images not displayed]

FINDINGS: Criteria: Quantification of carotid stenosis is based on velocity
parameters that correlate the residual internal carotid diameter
with NASCET-based stenosis levels, using the diameter of the distal
internal carotid lumen as the denominator for stenosis measurement.

The following velocity measurements were obtained:

RIGHT

ICA: 114/19 cm/sec

CCA: 39/6 cm/sec

SYSTOLIC ICA/CCA RATIO:

ECA: 102 cm/sec

LEFT

ICA: 99/43 cm/sec

CCA: 50/8 cm/sec

SYSTOLIC ICA/CCA RATIO:

ECA: 84 cm/sec

RIGHT CAROTID ARTERY: Minimal calcified plaque of the distal common
carotid artery.

RIGHT VERTEBRAL ARTERY:  Antegrade flow.

LEFT CAROTID ARTERY:  Mild calcified plaque of the ICA origin.

LEFT VERTEBRAL ARTERY:  Antegrade flow.
IMPRESSION: No significant stenosis of internal carotid arteries.

## 2023-11-21 ENCOUNTER — Non-Acute Institutional Stay: Payer: Self-pay | Admitting: Nurse Practitioner

## 2023-11-21 ENCOUNTER — Encounter: Payer: Self-pay | Admitting: Nurse Practitioner

## 2023-11-21 DIAGNOSIS — E039 Hypothyroidism, unspecified: Secondary | ICD-10-CM

## 2023-11-21 DIAGNOSIS — H903 Sensorineural hearing loss, bilateral: Secondary | ICD-10-CM | POA: Insufficient documentation

## 2023-11-21 DIAGNOSIS — G629 Polyneuropathy, unspecified: Secondary | ICD-10-CM

## 2023-11-21 DIAGNOSIS — E785 Hyperlipidemia, unspecified: Secondary | ICD-10-CM

## 2023-11-21 DIAGNOSIS — G3184 Mild cognitive impairment, so stated: Secondary | ICD-10-CM | POA: Insufficient documentation

## 2023-11-21 DIAGNOSIS — I10 Essential (primary) hypertension: Secondary | ICD-10-CM

## 2023-11-21 DIAGNOSIS — K5904 Chronic idiopathic constipation: Secondary | ICD-10-CM | POA: Insufficient documentation

## 2023-11-21 DIAGNOSIS — F419 Anxiety disorder, unspecified: Secondary | ICD-10-CM | POA: Insufficient documentation

## 2023-11-21 NOTE — Assessment & Plan Note (Signed)
 Stalbe, continues on gabapetin 400 g at bedtime

## 2023-11-21 NOTE — Assessment & Plan Note (Signed)
 Stable, no acute changes in cognitive or functional status, continue supportive care.

## 2023-11-21 NOTE — Assessment & Plan Note (Signed)
 Controlled on current regimen

## 2023-11-21 NOTE — Assessment & Plan Note (Signed)
 Continues with bilateral hearing aides

## 2023-11-21 NOTE — Assessment & Plan Note (Signed)
 TSH at goal on synthroid  88 mcg daily Yearly TSH

## 2023-11-21 NOTE — Progress Notes (Signed)
 Location:  Other [facility].  Nursing Home Room Number: [name] [id number] Place of Service:  ALF (13) Nicole An, NP  PCP: Laurence Locus, DO  Patient Care Team: Laurence Locus, DO as PCP - General (Internal Medicine)  Extended Emergency Contact Information Primary Emergency Contact: Bassin,Bill Mobile Phone: [phone] Relation: Son  Goals of care: Advanced Directive information    11/21/2023    1:20 PM  Advanced Directives  Does Patient Have a Medical Advance Directive? Yes  Type of Estate Agent of [place];Out of facility DNR (pink MOST or yellow form)  Does patient want to make changes to medical advance directive? No - Patient declined  Copy of Healthcare Power of Attorney in Chart? Yes - validated most recent copy scanned in chart (See row information)     Chief Complaint  Patient presents with   Medical Management of Chronic Issues    Medical Management of Chronic Issues.     HPI:  Pt is a 89 y.o. female seen today for medical management of chronic disease.  Pt with hx of CAD, GERD, hypertension, hyperlipidemia  She has been complaining to her son about her sleep.  Reports she did not sleep good last night Otherwise he has no concerns.  She denies anxiety or depression  Denies constipation at this time  No increase in pain.  Reports she wears compression hose and that helps with swelling.   Reports she eats well. She states she eats a good breakfast so she is not that hungry at lunch    Past Medical History:  Diagnosis Date   Carotid arterial disease    GERD (gastroesophageal reflux disease)    Hyperlipidemia    Hypertension    Hypothyroidism    MDD (major depressive disorder), single episode, in full remission    Polyneuropathy    Past Surgical History:  Procedure Laterality Date   ORIF DISTAL RADIUS FRACTURE     with redo twice   TOTAL HIP ARTHROPLASTY Right ~2011    Allergies  Allergen Reactions   Avelox  [Moxifloxacin]    Levaquin [Levofloxacin]     Outpatient Encounter Medications as of 11/21/2023  Medication Sig   acetaminophen (TYLENOL) 325 MG tablet Take 650 mg by mouth every 4 (four) hours as needed.   acetaminophen (TYLENOL) 650 MG CR tablet Take 650 mg by mouth at bedtime.   aluminum-magnesium hydroxide 200-200 MG/5ML suspension Take 5 mLs by mouth every 4 (four) hours as needed for indigestion.   amLODipine (NORVASC) 5 MG tablet Take 5 mg by mouth daily.   atorvastatin  (LIPITOR) 10 MG tablet Take 1 tablet (10 mg total) by mouth daily.   bismuth subsalicylate (PEPTO BISMOL) 262 MG/15ML suspension Take 30 mLs by mouth as needed.   busPIRone  (BUSPAR ) 5 MG tablet Take 5 mg by mouth 2 (two) times daily.   Calcium  Carbonate (CALCIUM  600 PO) Take 1 capsule by mouth daily.   carbamide peroxide (DEBROX) 6.5 % OTIC solution Place 5 drops into both ears as needed.   cetirizine (ZYRTEC) 5 MG tablet Take 5 mg by mouth daily.   Cholecalciferol (VITAMIN D3) 50 MCG (2000 UT) TABS Give one by mouth once daily.   clopidogrel  (PLAVIX ) 75 MG tablet Take 1 tablet (75 mg total) by mouth daily.   dextromethorphan-guaiFENesin (ROBITUSSIN-DM) 10-100 MG/5ML liquid Take 5 mLs by mouth every 4 (four) hours as needed for cough.   docusate sodium (COLACE) 100 MG capsule Take 100 mg by mouth daily.   gabapentin  (NEURONTIN ) 400  MG capsule TAKE TWO CAPSULES BY MOUTH AT BEDTIME   Glucose 15 GM/32ML GEL Take 1 packet by mouth as needed (for low blood sugar).   hydrochlorothiazide  (MICROZIDE ) 12.5 MG capsule Take 12.5 mg by mouth daily.   levothyroxine  (SYNTHROID ) 88 MCG tablet Take 88 mcg by mouth daily before breakfast.   losartan  (COZAAR ) 100 MG tablet Take 1 tablet (100 mg total) by mouth daily.   magnesium hydroxide (MILK OF MAGNESIA) 400 MG/5ML suspension Take 5 mLs by mouth daily as needed for mild constipation.   metoprolol  succinate (TOPROL -XL) 25 MG 24 hr tablet Take 1 tablet (25 mg total) by mouth 2 (two)  times daily.   mirtazapine  (REMERON ) 30 MG tablet Take 1 tablet (30 mg total) by mouth at bedtime.   nystatin (MYCOSTATIN/NYSTOP) powder Apply 1 Application topically 2 (two) times daily as needed.   ondansetron  (ZOFRAN ) 4 MG tablet Take 4 mg by mouth every 8 (eight) hours as needed for nausea or vomiting.   OXYGEN 2lpm for dyspnea or SOB   polyethylene glycol (MIRALAX / GLYCOLAX) 17 g packet Take 17 g by mouth as needed.   vitamin B-12 (CYANOCOBALAMIN) 1000 MCG tablet Take 1,000 mcg by mouth daily.   [DISCONTINUED] busPIRone  (BUSPAR ) 5 MG tablet Take 1 tablet (5 mg total) by mouth 2 (two) times daily as needed (ANXIETY). (Patient not taking: Reported on 11/21/2023)   No facility-administered encounter medications on file as of 11/21/2023.    Review of Systems  Constitutional:  Negative for activity change, appetite change, fatigue and unexpected weight change.  HENT:  Negative for congestion and hearing loss.   Eyes: Negative.   Respiratory:  Negative for cough and shortness of breath.   Cardiovascular:  Negative for chest pain, palpitations and leg swelling.  Gastrointestinal:  Negative for abdominal pain, constipation and diarrhea.  Genitourinary:  Negative for difficulty urinating and dysuria.  Musculoskeletal:  Negative for arthralgias and myalgias.  Skin:  Negative for color change and wound.  Neurological:  Negative for dizziness and weakness.  Psychiatric/Behavioral:  Positive for sleep disturbance. Negative for agitation, behavioral problems and confusion.      Immunization History  Administered Date(s) Administered   Influenza-Unspecified 11/11/2022   Moderna Covid-19 Fall Seasonal Vaccine 102yrs & older 05/05/2023   Moderna Sars-Covid-2 Vaccination 01/25/2019, 02/24/2019, 12/06/2019, 06/11/2020, 12/08/2022   PNEUMOCOCCAL CONJUGATE-20 11/03/2021   Respiratory Syncytial Virus Vaccine,Recomb Aduvanted(Arexvy) 11/03/2021   Tdap 11/02/2022   Zoster Recombinant(Shingrix)  07/31/2017, 10/02/2017   Pertinent  Health Maintenance Due  Topic Date Due   Influenza Vaccine  08/25/2023   DEXA SCAN  Discontinued      08/11/2021    9:33 AM 10/29/2021    1:21 PM 03/07/2022    2:24 PM 03/29/2022   10:46 AM 11/01/2022    2:13 PM  Fall Risk  Falls in the past year? 0 0 1 1 0  Was there [place] injury with Fall?  0 0 0   Fall Risk Category Calculator  0 2 1   Fall Risk Category (Retired)  Low      (RETIRED) Patient Fall Risk Level  Low fall risk      Patient at Risk for Falls Due to  No Fall Risks History of fall(s) No Fall Risks Impaired balance/gait  Fall risk Follow up  Falls evaluation completed  Falls evaluation completed Falls evaluation completed Education provided     Data saved with a previous flowsheet row definition   Functional Status Survey:    Vitals:  11/21/23 1309  BP: 125/65  Pulse: 70  Resp: 18  Temp: 98.2 F (36.8 C)  SpO2: 91%  Weight: 163 lb 6.4 oz (74.1 kg)  Height: 5' 8 (1.727 m)   Body mass index is 24.84 kg/m. Wt Readings from Last 3 Encounters:  11/21/23 163 lb 6.4 oz (74.1 kg)  07/07/23 158 lb 12.8 oz (72 kg)  04/04/23 155 lb 8 oz (70.5 kg)    Physical Exam Constitutional:      General: She is not in acute distress.    Appearance: She is well-developed. She is not diaphoretic.  HENT:     Head: Normocephalic and atraumatic.     Mouth/Throat:     Pharynx: No oropharyngeal exudate.  Eyes:     Conjunctiva/sclera: Conjunctivae normal.     Pupils: Pupils are equal, round, and reactive to light.  Cardiovascular:     Rate and Rhythm: Normal rate and regular rhythm.     Heart sounds: Normal heart sounds.  Pulmonary:     Effort: Pulmonary effort is normal.     Breath sounds: Normal breath sounds.  Abdominal:     General: Bowel sounds are normal.     Palpations: Abdomen is soft.  Musculoskeletal:     Cervical back: Normal range of motion and neck supple.     Right lower leg: No edema.     Left lower leg: No edema.  Skin:     General: Skin is warm and dry.  Neurological:     Mental Status: She is alert and oriented to person, place, and time. Mental status is at baseline.  Psychiatric:        Mood and Affect: Mood normal.     Labs reviewed: Recent Labs    04/06/23 0000 10/09/23 0000 10/19/23 0000  NA 135* 138 137  K 4.1 3.7 3.6  CL 98* 100 98*  CO2 30* 30* 32*  BUN 10 20 16   CREATININE 0.7 1.0 0.9  CALCIUM  9.8 9.7 9.7   Recent Labs    12/29/22 0000 04/06/23 0000 10/09/23 0000  AST 18 16 15   ALT 26 7 7   ALKPHOS 87 59 67  ALBUMIN 4.2 4.2 4.1   Recent Labs    12/29/22 0000 04/06/23 0000 10/09/23 0000  WBC 15.8 4.6 4.0  NEUTROABS 12,845.00 1,776.00 1,312.00  HGB 10.1* 10.5* 10.7*  HCT 31* 32* 33*  PLT 205 191 170   Lab Results  Component Value Date   TSH 4.14 10/09/2023   Lab Results  Component Value Date   HGBA1C 5.3 10/09/2023   Lab Results  Component Value Date   CHOL 140 04/06/2023   HDL 50 04/06/2023   LDLCALC 74 04/06/2023   TRIG 78 04/06/2023   CHOLHDL 2.9 03/31/2022    Significant Diagnostic Results in last 30 days:  No results found.  Assessment/Plan Sensorineural hearing loss, bilateral Continues with bilateral hearing aides  Neuropathy Stalbe, continues on gabapetin 400 g at bedtime   MCI (mild cognitive impairment) Stable, no acute changes in cognitive or functional status, continue supportive care.   Hypothyroidism TSH at goal on synthroid  88 mcg daily Yearly TSH  Hypertension Blood pressure well controlled Continue current medications and dietary modifications follow metabolic panel  Hyperlipidemia Continues on lipitor LDL controlled Follow yearly   Chronic idiopathic constipation Controlled on current regimen  Anxiety Stable on remeron  and buspar      Damyan Corne K. Caro BODILY [facility] [phone]

## 2023-11-21 NOTE — Assessment & Plan Note (Signed)
 Blood pressure well controlled Continue current medications and dietary modifications follow metabolic panel

## 2023-11-21 NOTE — Assessment & Plan Note (Signed)
 Stable on remeron  and buspar 

## 2023-11-21 NOTE — Assessment & Plan Note (Signed)
 Continues on lipitor LDL controlled Follow yearly

## 2023-11-23 ENCOUNTER — Non-Acute Institutional Stay: Payer: Self-pay | Admitting: Nurse Practitioner

## 2023-11-23 ENCOUNTER — Encounter: Payer: Self-pay | Admitting: Nurse Practitioner

## 2023-11-23 DIAGNOSIS — Z0000 Encounter for general adult medical examination without abnormal findings: Secondary | ICD-10-CM

## 2023-11-23 NOTE — Patient Instructions (Signed)
  Ms. Wallington , Thank you for taking time to come for your Medicare Wellness Visit. I appreciate your ongoing commitment to your health goals. Please review the following plan we discussed and let me know if I can assist you in the future.   You have received flu and covid through [facility]  This is a list of the screening recommended for you and due dates:  Health Maintenance  Topic Date Due   Flu Shot  08/25/2023   COVID-19 Vaccine (7 - 2025-26 season) 09/25/2023   Medicare Annual Wellness Visit  11/22/2024   DTaP/Tdap/Td vaccine (2 - Td or Tdap) 11/01/2032   Pneumococcal Vaccine for age over 71  Completed   Zoster (Shingles) Vaccine  Completed   Meningitis B Vaccine  Aged Out   DEXA scan (bone density measurement)  Discontinued

## 2023-11-23 NOTE — Progress Notes (Signed)
 Subjective:   Nicole Ellison is a 89 y.o. female who presents for Medicare Annual (Subsequent) preventive examination.  Visit Complete: In person TL    Cardiac Risk Factors include: advanced age (>54men, >38 women);dyslipidemia;hypertension;sedentary lifestyle     Objective:    Today's Vitals   11/23/23 1108  BP: 125/65  Pulse: 70  Resp: 18  Temp: 98.2 F (36.8 C)  SpO2: 91%  Weight: 163 lb 6.4 oz (74.1 kg)  Height: 5' 8 (1.727 m)   Body mass index is 24.84 kg/m.     11/21/2023    1:20 PM 11/01/2022    2:16 PM 10/04/2022    3:11 PM 07/04/2022    9:06 AM 06/24/2022    2:46 PM 03/29/2022   10:48 AM 10/29/2021    1:22 PM  Advanced Directives  Does Patient Have a Medical Advance Directive? Yes Yes Yes Yes Yes Yes Yes  Type of Estate Agent of [place];Out of facility DNR (pink MOST or yellow form) Healthcare Power of [place];Out of facility DNR (pink MOST or yellow form) Healthcare Power of [place];Out of facility DNR (pink MOST or yellow form) Healthcare Power of [place];Out of facility DNR (pink MOST or yellow form) Out of facility DNR (pink MOST or yellow form);Healthcare Power of [employer] of facility DNR (pink MOST or yellow form);Healthcare Power of [employer] of facility DNR (pink MOST or yellow form)  Does patient want to make changes to medical advance directive? No - Patient declined No - Patient declined No - Patient declined No - Patient declined No - Patient declined No - Patient declined No - Patient declined  Copy of Healthcare Power of Attorney in Chart? Yes - validated most recent copy scanned in chart (See row information) Yes - validated most recent copy scanned in chart (See row information) Yes - validated most recent copy scanned in chart (See row information) Yes - validated most recent copy scanned in chart (See row information) No - copy requested No - copy requested   Pre-existing out of facility DNR order (yellow form or pink MOST  form)  Yellow form placed in chart (order not valid for inpatient use)    Yellow form placed in chart (order not valid for inpatient use) Yellow form placed in chart (order not valid for inpatient use)    Current Medications (verified) Outpatient Encounter Medications as of 11/23/2023  Medication Sig   acetaminophen (TYLENOL) 325 MG tablet Take 650 mg by mouth every 4 (four) hours as needed.   acetaminophen (TYLENOL) 650 MG CR tablet Take 650 mg by mouth at bedtime.   aluminum-magnesium hydroxide 200-200 MG/5ML suspension Take 5 mLs by mouth every 4 (four) hours as needed for indigestion.   amLODipine (NORVASC) 5 MG tablet Take 5 mg by mouth daily.   atorvastatin  (LIPITOR) 10 MG tablet Take 1 tablet (10 mg total) by mouth daily.   bismuth subsalicylate (PEPTO BISMOL) 262 MG/15ML suspension Take 30 mLs by mouth as needed.   busPIRone  (BUSPAR ) 5 MG tablet Take 5 mg by mouth 2 (two) times daily.   Calcium  Carbonate (CALCIUM  600 PO) Take 1 capsule by mouth daily.   carbamide peroxide (DEBROX) 6.5 % OTIC solution Place 5 drops into both ears as needed.   cetirizine (ZYRTEC) 5 MG tablet Take 5 mg by mouth daily.   Cholecalciferol (VITAMIN D3) 50 MCG (2000 UT) TABS Give one by mouth once daily.   clopidogrel  (PLAVIX ) 75 MG tablet Take 1 tablet (75 mg total) by mouth  daily.   dextromethorphan-guaiFENesin (ROBITUSSIN-DM) 10-100 MG/5ML liquid Take 5 mLs by mouth every 4 (four) hours as needed for cough.   docusate sodium (COLACE) 100 MG capsule Take 100 mg by mouth daily.   gabapentin  (NEURONTIN ) 400 MG capsule TAKE TWO CAPSULES BY MOUTH AT BEDTIME   Glucose 15 GM/32ML GEL Take 1 packet by mouth as needed (for low blood sugar).   hydrochlorothiazide  (MICROZIDE ) 12.5 MG capsule Take 12.5 mg by mouth daily.   levothyroxine  (SYNTHROID ) 88 MCG tablet Take 88 mcg by mouth daily before breakfast.   losartan  (COZAAR ) 100 MG tablet Take 1 tablet (100 mg total) by mouth daily.   magnesium hydroxide (MILK OF  MAGNESIA) 400 MG/5ML suspension Take 5 mLs by mouth daily as needed for mild constipation.   metoprolol  succinate (TOPROL -XL) 25 MG 24 hr tablet Take 1 tablet (25 mg total) by mouth 2 (two) times daily.   mirtazapine  (REMERON ) 30 MG tablet Take 1 tablet (30 mg total) by mouth at bedtime.   nystatin (MYCOSTATIN/NYSTOP) powder Apply 1 Application topically 2 (two) times daily as needed.   ondansetron  (ZOFRAN ) 4 MG tablet Take 4 mg by mouth every 8 (eight) hours as needed for nausea or vomiting.   OXYGEN 2lpm for dyspnea or SOB   polyethylene glycol (MIRALAX / GLYCOLAX) 17 g packet Take 17 g by mouth as needed.   vitamin B-12 (CYANOCOBALAMIN) 1000 MCG tablet Take 1,000 mcg by mouth daily.   No facility-administered encounter medications on file as of 11/23/2023.    Allergies (verified) Avelox [moxifloxacin] and Levaquin [levofloxacin]   History: Past Medical History:  Diagnosis Date   Carotid arterial disease    GERD (gastroesophageal reflux disease)    Hyperlipidemia    Hypertension    Hypothyroidism    MDD (major depressive disorder), single episode, in full remission    Polyneuropathy    Past Surgical History:  Procedure Laterality Date   ORIF DISTAL RADIUS FRACTURE     with redo twice   TOTAL HIP ARTHROPLASTY Right ~2011   Family History  Problem Relation Age of Onset   Alzheimer's disease Mother    Heart disease Father    Liver cancer Sister    Alzheimer's disease Brother    Social History   Socioeconomic History   Marital status: Widowed    Spouse name: Not on file   Number of children: 2   Years of education: Not on file   Highest education level: Not on file  Occupational History   Occupation: Bank Teller    Comment: Retired 1995  Tobacco Use   Smoking status: Never   Smokeless tobacco: Never  Vaping Use   Vaping status: Never Used  Substance and Sexual Activity   Alcohol use: Not Currently   Drug use: Never   Sexual activity: Not on file  Other Topics  Concern   Not on file  Social History Narrative   Has living will   Son Zell is health care POA   Has DNR --confirmed   No tube feeds   Social Drivers of Corporate Investment Banker Strain: Not on file  Food Insecurity: Not on file  Transportation Needs: Not on file  Physical Activity: Not on file  Stress: Not on file  Social Connections: Not on file    Tobacco Counseling Counseling given: Not Answered   Clinical Intake:  Pre-visit preparation completed: Yes  Pain : No/denies pain     BMI - recorded: 24.84 Nutritional Status: BMI 25 -29 Overweight  Nutritional Risks: None  How often do you need to have someone help you when you read instructions, pamphlets, or other written materials from your doctor or pharmacy?: 3 - Sometimes         Activities of Daily Living    11/23/2023   11:33 AM  In your present state of health, do you have any difficulty performing the following activities:  Hearing? 1  Vision? 0  Difficulty concentrating or making decisions? 0  Walking or climbing stairs? 1  Comment can not climb stairs  Dressing or bathing? 0  Doing errands, shopping? 1  Preparing Food and eating ? N  Comment does not prepare food  Using the Toilet? N  In the past six months, have you accidently leaked urine? N  Do you have problems with loss of bowel control? N  Managing your Medications? Y  Comment nursing helps  Managing your Finances? Y  Comment son does Network Engineer? Y    Patient Care Team: Laurence Locus, DO as PCP - General (Internal Medicine)  Indicate any recent Medical Services you may have received from other than [facility] providers in the past year (date may be approximate).     Assessment:   This is a routine wellness examination for Shamell.  Hearing/Vision screen No results found.   Goals Addressed   None    Depression Screen    11/23/2023   11:35 AM 03/29/2022   10:46 AM 08/11/2021    9:33 AM  02/22/2021   10:53 AM 08/04/2020   10:34 AM 05/26/2020    7:47 AM 08/07/2019   11:51 AM  PHQ 2/9 Scores  PHQ - 2 Score 0 0 1 0  0 0  PHQ- 9 Score    0     Exception Documentation     Medical reason      Fall Risk    11/23/2023   11:35 AM 11/01/2022    2:13 PM 03/29/2022   10:46 AM 03/07/2022    2:24 PM 10/29/2021    1:21 PM  Fall Risk   Falls in the past year? 1 0 1 1 0  Number falls in past yr: 0 0 0 1 0  Injury with Fall? 0  0 0 0  Risk for fall due to : History of fall(s);Impaired balance/gait;Impaired mobility Impaired balance/gait No Fall Risks History of fall(s) No Fall Risks  Follow up Falls evaluation completed Education provided Falls evaluation completed Falls evaluation completed Falls evaluation completed      Data saved with a previous flowsheet row definition    MEDICARE RISK AT HOME: Medicare Risk at Home Any stairs in or around the home?: Yes If so, are there any without handrails?: No Home free of loose throw rugs in walkways, pet beds, electrical cords, etc?: Yes Adequate lighting in your home to reduce risk of falls?: Yes Life alert?: Yes Use of a cane, walker or w/c?: Yes Grab bars in the bathroom?: Yes Shower chair or bench in shower?: Yes Elevated toilet seat or a handicapped toilet?: Yes  TIMED UP AND GO:  Was the test performed?  No    Cognitive Function:        Immunizations Immunization History  Administered Date(s) Administered   Influenza-Unspecified 11/11/2022, 11/07/2023   Moderna Covid-19 Fall Seasonal Vaccine 7yrs & older 05/05/2023   Moderna Sars-Covid-2 Vaccination 01/25/2019, 02/24/2019, 12/06/2019, 06/11/2020, 12/08/2022   PNEUMOCOCCAL CONJUGATE-20 11/03/2021   Respiratory Syncytial Virus Vaccine,Recomb Aduvanted(Arexvy) 11/03/2021   Tdap  11/02/2022   Unspecified SARS-COV-2 Vaccination 11/17/2023   Zoster Recombinant(Shingrix) 07/31/2017, 10/02/2017    TDAP status: Up to date  Flu Vaccine status: Up to date  Pneumococcal  vaccine status: Up to date  Covid-19 vaccine status: Information provided on how to obtain vaccines.   Qualifies for Shingles Vaccine? Yes   Zostavax completed No   Shingrix Completed?: Yes  Screening Tests Health Maintenance  Topic Date Due   COVID-19 Vaccine (8 - Moderna risk 2025-26 season) 05/17/2024   Medicare Annual Wellness (AWV)  11/22/2024   DTaP/Tdap/Td (2 - Td or Tdap) 11/01/2032   Pneumococcal Vaccine: 50+ Years  Completed   Influenza Vaccine  Completed   Zoster Vaccines- Shingrix  Completed   Meningococcal B Vaccine  Aged Out   DEXA SCAN  Discontinued    Health Maintenance  There are no preventive care reminders to display for this patient.   Colorectal cancer screening: No longer required.   Mammogram status: No longer required due to age.   Lung Cancer Screening: (Low Dose CT Chest recommended if Age 17-80 years, 20 pack-year currently smoking OR have quit w/in 15years.) does not qualify.   Lung Cancer Screening Referral: na  Additional Screening:  Hepatitis C Screening: does not qualify  Vision Screening: Recommended annual ophthalmology exams for early detection of glaucoma and other disorders of the eye. Is the patient up to date with their annual eye exam?  No  Who is the provider or what is the name of the office in which the patient attends annual eye exams? unsure If pt is not established with a provider, would they like to be referred to a provider to establish care? No .   Dental Screening: Recommended annual dental exams for proper oral hygiene   Community Resource Referral / Chronic Care Management: CRR required this visit?  No   CCM required this visit?  No     Plan:     I have personally reviewed and noted the following in the patient's chart:   Medical and social history Use of alcohol, tobacco or illicit drugs  Current medications and supplements including opioid prescriptions. Patient is not currently taking opioid  prescriptions. Functional ability and status Nutritional status Physical activity Advanced directives List of other physicians Hospitalizations, surgeries, and ER visits in previous 12 months Vitals Screenings to include cognitive, depression, and falls Referrals and appointments  In addition, I have reviewed and discussed with patient certain preventive protocols, quality metrics, and best practice recommendations. A written personalized care plan for preventive services as well as general preventive health recommendations were provided to patient.     Harlene MARLA An, NP   11/23/2023

## 2024-01-30 ENCOUNTER — Encounter: Payer: Self-pay | Admitting: Nurse Practitioner
